# Patient Record
Sex: Female | Born: 1990 | Race: Black or African American | Hispanic: No | Marital: Single | Smoking: Never smoker
Health system: Southern US, Community
[De-identification: ages and names within clinical notes are randomized; demographics above are authoritative.]

---

## 2008-06-06 ENCOUNTER — Emergency Department (HOSPITAL_COMMUNITY): Admission: EM | Admit: 2008-06-06 | Discharge: 2008-06-06 | Payer: Self-pay | Admitting: Emergency Medicine

## 2013-08-04 ENCOUNTER — Encounter: Payer: Self-pay | Admitting: Obstetrics & Gynecology

## 2013-08-04 ENCOUNTER — Ambulatory Visit (INDEPENDENT_AMBULATORY_CARE_PROVIDER_SITE_OTHER): Payer: Self-pay

## 2013-08-04 DIAGNOSIS — Z3201 Encounter for pregnancy test, result positive: Secondary | ICD-10-CM

## 2013-08-04 DIAGNOSIS — N926 Irregular menstruation, unspecified: Secondary | ICD-10-CM

## 2013-08-04 LAB — POCT PREGNANCY, URINE: Preg Test, Ur: POSITIVE — AB

## 2013-08-04 LAB — HIV ANTIBODY (ROUTINE TESTING W REFLEX): HIV: NONREACTIVE

## 2013-08-04 NOTE — Progress Notes (Signed)
Pt. Here for pregnancy test; has not had her period since April 15, 2013. Pt. Would like to start care here. Labs to be drawn today.

## 2013-08-05 LAB — OBSTETRIC PANEL
Antibody Screen: NEGATIVE
Basophils Absolute: 0 10*3/uL (ref 0.0–0.1)
Basophils Relative: 0 % (ref 0–1)
HCT: 31 % — ABNORMAL LOW (ref 36.0–46.0)
Hepatitis B Surface Ag: NEGATIVE
MCH: 29.3 pg (ref 26.0–34.0)
MCHC: 34.8 g/dL (ref 30.0–36.0)
Monocytes Absolute: 0.5 10*3/uL (ref 0.1–1.0)
Monocytes Relative: 6 % (ref 3–12)
Neutro Abs: 6.3 10*3/uL (ref 1.7–7.7)
Neutrophils Relative %: 76 % (ref 43–77)
RDW: 14.4 % (ref 11.5–15.5)
WBC: 8.3 10*3/uL (ref 4.0–10.5)

## 2013-08-07 LAB — HEMOGLOBINOPATHY EVALUATION
Hgb A2 Quant: 3 % (ref 2.2–3.2)
Hgb F Quant: 0 % (ref 0.0–2.0)
Hgb S Quant: 39.8 % — ABNORMAL HIGH

## 2013-08-25 ENCOUNTER — Other Ambulatory Visit (HOSPITAL_COMMUNITY)
Admission: RE | Admit: 2013-08-25 | Discharge: 2013-08-25 | Disposition: A | Discharge status: Home / Self Care | Payer: Medicaid Other | Source: Ambulatory Visit | Attending: Family Medicine | Admitting: Family Medicine

## 2013-08-25 ENCOUNTER — Encounter: Payer: Self-pay | Admitting: Family Medicine

## 2013-08-25 ENCOUNTER — Ambulatory Visit (INDEPENDENT_AMBULATORY_CARE_PROVIDER_SITE_OTHER): Payer: Medicaid Other | Admitting: Family Medicine

## 2013-08-25 VITALS — BP 112/71 | Temp 98.5°F | Ht 63.0 in | Wt 131.0 lb

## 2013-08-25 DIAGNOSIS — Z113 Encounter for screening for infections with a predominantly sexual mode of transmission: Secondary | ICD-10-CM | POA: Insufficient documentation

## 2013-08-25 DIAGNOSIS — Z3480 Encounter for supervision of other normal pregnancy, unspecified trimester: Secondary | ICD-10-CM

## 2013-08-25 DIAGNOSIS — Z01419 Encounter for gynecological examination (general) (routine) without abnormal findings: Secondary | ICD-10-CM | POA: Insufficient documentation

## 2013-08-25 DIAGNOSIS — Z3490 Encounter for supervision of normal pregnancy, unspecified, unspecified trimester: Secondary | ICD-10-CM | POA: Insufficient documentation

## 2013-08-25 DIAGNOSIS — Z331 Pregnant state, incidental: Secondary | ICD-10-CM

## 2013-08-25 DIAGNOSIS — Z23 Encounter for immunization: Secondary | ICD-10-CM

## 2013-08-25 DIAGNOSIS — Z3492 Encounter for supervision of normal pregnancy, unspecified, second trimester: Secondary | ICD-10-CM

## 2013-08-25 LAB — POCT URINALYSIS DIP (DEVICE)
Glucose, UA: NEGATIVE mg/dL
Hgb urine dipstick: NEGATIVE
Nitrite: NEGATIVE
Protein, ur: NEGATIVE mg/dL
Urobilinogen, UA: 0.2 mg/dL (ref 0.0–1.0)

## 2013-08-25 NOTE — Progress Notes (Signed)
   Subjective:    Margaret [name] is a G1P0 [redacted]w[redacted]d being seen today for her first obstetrical visit.  Her obstetrical history is significant for none. Patient does intend to breast feed. Pregnancy history fully reviewed.  Patient reports no bleeding, no contractions, no cramping and no leaking.  Filed Vitals:   08/25/13 1317 08/25/13 1320  BP: 112/71   Temp: 98.5 F (36.9 C)   Height:  5\' 3"  (1.6 m)  Weight: 131 lb (59.421 kg)     HISTORY: OB History  Gravida Para Term Preterm AB SAB TAB Ectopic Multiple Living  1             # Outcome Date GA Lbr Len/2nd Weight Sex Delivery Anes PTL Lv  1 CUR              History reviewed. No pertinent past medical history. History reviewed. No pertinent past surgical history. Family History  Problem Relation Age of Onset  . Hypertension Mother   . Diabetes Father   . Stroke Maternal Grandmother   . Cancer Maternal Grandfather   . Cancer Paternal Grandmother      Exam    Uterus:     Pelvic Exam:    Perineum: No Hemorrhoids   Vulva: normal   Vagina:  normal mucosa   pH: Not eval   Cervix: bleeding following pap improved with pressure   Adnexa: not evaluated   Bony Pelvis: average  System:     Skin: normal coloration and turgor, no rashes    Neurologic: oriented, normal, negative, normal mood   Extremities: normal strength, tone, and muscle mass   HEENT extra ocular movement intact and sclera clear, anicteric   Mouth/Teeth mucous membranes moist, pharynx normal without lesions   Neck supple and no masses   Cardiovascular: regular rate and rhythm, no murmurs or gallops   Respiratory:  appears well, vitals normal, no respiratory distress, acyanotic, normal RR, ear and throat exam is normal, neck free of mass or lymphadenopathy, chest clear, no wheezing, crepitations, rhonchi, normal symmetric air entry   Abdomen: soft, non-tender; bowel sounds normal; no masses,  no organomegaly   Urinary: urethral meatus normal       Assessment:    Pregnancy: G1P0 Patient Active Problem List   Diagnosis Date Noted  . Supervision of low-risk pregnancy 08/25/2013        Plan:     Initial labs drawn. Prenatal vitamins. Problem list reviewed and updated. Genetic Screening discussed Quad Screen: ordered.  Ultrasound discussed; fetal survey: ordered.  Follow up in 4 weeks. 60% of 45 min visit spent on counseling and coordination of care.   F/u for peristent bleeding not resolved in 2d or if passing clots to ED.   Tawana Scale 08/25/2013

## 2013-08-25 NOTE — Progress Notes (Signed)
P=84  Initial prenatal care visit

## 2013-08-25 NOTE — Patient Instructions (Signed)
Second Trimester of Pregnancy The second trimester is from week 13 through week 28, months 4 through 6. The second trimester is often a time when you feel your best. Your body has also adjusted to being pregnant, and you begin to feel better physically. Usually, morning sickness has lessened or quit completely, you may have more energy, and you may have an increase in appetite. The second trimester is also a time when the fetus is growing rapidly. At the end of the sixth month, the fetus is about 9 inches long and weighs about 1 pounds. You will likely begin to feel the baby move (quickening) between 18 and 20 weeks of the pregnancy. BODY CHANGES Your body goes through many changes during pregnancy. The changes vary from woman to woman.   Your weight will continue to increase. You will notice your lower abdomen bulging out.  You may begin to get stretch marks on your hips, abdomen, and breasts.  You may develop headaches that can be relieved by medicines approved by your caregiver.  You may urinate more often because the fetus is pressing on your bladder.  You may develop or continue to have heartburn as a result of your pregnancy.  You may develop constipation because certain hormones are causing the muscles that push waste through your intestines to slow down.  You may develop hemorrhoids or swollen, bulging veins (varicose veins).  You may have back pain because of the weight gain and pregnancy hormones relaxing your joints between the bones in your pelvis and as a result of a shift in weight and the muscles that support your balance.  Your breasts will continue to grow and be tender.  Your gums may bleed and may be sensitive to brushing and flossing.  Dark spots or blotches (chloasma, mask of pregnancy) may develop on your face. This will likely fade after the baby is born.  A dark line from your belly button to the pubic area (linea nigra) may appear. This will likely fade after the  baby is born. WHAT TO EXPECT AT YOUR PRENATAL VISITS During a routine prenatal visit:  You will be weighed to make sure you and the fetus are growing normally.  Your blood pressure will be taken.  Your abdomen will be measured to track your baby's growth.  The fetal heartbeat will be listened to.  Any test results from the previous visit will be discussed. Your caregiver may ask you:  How you are feeling.  If you are feeling the baby move.  If you have had any abnormal symptoms, such as leaking fluid, bleeding, severe headaches, or abdominal cramping.  If you have any questions. Other tests that may be performed during your second trimester include:  Blood tests that check for:  Low iron levels (anemia).  Gestational diabetes (between 24 and 28 weeks).  Rh antibodies.  Urine tests to check for infections, diabetes, or protein in the urine.  An ultrasound to confirm the proper growth and development of the baby.  An amniocentesis to check for possible genetic problems.  Fetal screens for spina bifida and Down syndrome. HOME CARE INSTRUCTIONS   Avoid all smoking, herbs, alcohol, and unprescribed drugs. These chemicals affect the formation and growth of the baby.  Follow your caregiver's instructions regarding medicine use. There are medicines that are either safe or unsafe to take during pregnancy.  Exercise only as directed by your caregiver. Experiencing uterine cramps is a good sign to stop exercising.  Continue to eat regular,   healthy meals.  Wear a good support bra for breast tenderness.  Do not use hot tubs, steam rooms, or saunas.  Wear your seat belt at all times when driving.  Avoid raw meat, uncooked cheese, cat litter boxes, and soil used by cats. These carry germs that can cause birth defects in the baby.  Take your prenatal vitamins.  Try taking a stool softener (if your caregiver approves) if you develop constipation. Eat more high-fiber foods,  such as fresh vegetables or fruit and whole grains. Drink plenty of fluids to keep your urine clear or pale yellow.  Take warm sitz baths to soothe any pain or discomfort caused by hemorrhoids. Use hemorrhoid cream if your caregiver approves.  If you develop varicose veins, wear support hose. Elevate your feet for 15 minutes, 3 4 times a day. Limit salt in your diet.  Avoid heavy lifting, wear low heel shoes, and practice good posture.  Rest with your legs elevated if you have leg cramps or low back pain.  Visit your dentist if you have not gone yet during your pregnancy. Use a soft toothbrush to brush your teeth and be gentle when you floss.  A sexual relationship may be continued unless your caregiver directs you otherwise.  Continue to go to all your prenatal visits as directed by your caregiver. SEEK MEDICAL CARE IF:   You have dizziness.  You have mild pelvic cramps, pelvic pressure, or nagging pain in the abdominal area.  You have persistent nausea, vomiting, or diarrhea.  You have a bad smelling vaginal discharge.  You have pain with urination. SEEK IMMEDIATE MEDICAL CARE IF:   You have a fever.  You are leaking fluid from your vagina.  You have spotting or bleeding from your vagina.  You have severe abdominal cramping or pain.  You have rapid weight gain or loss.  You have shortness of breath with chest pain.  You notice sudden or extreme swelling of your face, hands, ankles, feet, or legs.  You have not felt your baby move in over an hour.  You have severe headaches that do not go away with medicine.  You have vision changes. Document Released: 08/21/2001 Document Revised: 04/29/2013 Document Reviewed: 10/28/2012 ExitCare Patient Information 2014 [employer].  

## 2013-08-26 LAB — PRESCRIPTION MONITORING PROFILE (19 PANEL)
Barbiturate Screen, Urine: NEGATIVE ng/mL
Benzodiazepine Screen, Urine: NEGATIVE ng/mL
Cannabinoid Scrn, Ur: NEGATIVE ng/mL
Carisoprodol, Urine: NEGATIVE ng/mL
Cocaine Metabolites: NEGATIVE ng/mL
Creatinine, Urine: 68.65 mg/dL (ref >20.0)
Fentanyl, Ur: NEGATIVE ng/mL
MDMA URINE: NEGATIVE ng/mL
Meperidine, Ur: NEGATIVE ng/mL
Methadone Screen, Urine: NEGATIVE ng/mL
Nitrites, Initial: NEGATIVE ug/mL
Opiate Screen, Urine: NEGATIVE ng/mL
Oxycodone Screen, Ur: NEGATIVE ng/mL
Phencyclidine, Ur: NEGATIVE ng/mL
Tapentadol, urine: NEGATIVE ng/mL
Tramadol Scrn, Ur: NEGATIVE ng/mL
Zolpidem, Urine: NEGATIVE ng/mL
pH, Initial: 6.9 pH (ref 4.5–8.9)

## 2013-08-26 LAB — WET PREP, GENITAL
Clue Cells Wet Prep HPF POC: NONE SEEN
Trich, Wet Prep: NONE SEEN
WBC, Wet Prep HPF POC: NONE SEEN
Yeast Wet Prep HPF POC: NONE SEEN

## 2013-08-27 ENCOUNTER — Ambulatory Visit (HOSPITAL_COMMUNITY)
Admission: RE | Admit: 2013-08-27 | Discharge: 2013-08-27 | Disposition: A | Discharge status: Home / Self Care | Payer: Medicaid Other | Source: Ambulatory Visit | Attending: Family Medicine | Admitting: Family Medicine

## 2013-08-27 DIAGNOSIS — Z3689 Encounter for other specified antenatal screening: Secondary | ICD-10-CM | POA: Insufficient documentation

## 2013-08-27 DIAGNOSIS — Z3492 Encounter for supervision of normal pregnancy, unspecified, second trimester: Secondary | ICD-10-CM

## 2013-08-27 DIAGNOSIS — Z3490 Encounter for supervision of normal pregnancy, unspecified, unspecified trimester: Secondary | ICD-10-CM

## 2013-08-27 DIAGNOSIS — Z23 Encounter for immunization: Secondary | ICD-10-CM

## 2013-08-27 LAB — CULTURE, OB URINE
Colony Count: NO GROWTH
Organism ID, Bacteria: NO GROWTH

## 2013-08-31 LAB — AFP, QUAD SCREEN
AFP: 39.1 IU/mL
Age Alone: 1:1060 {titer}
Curr Gest Age: 18.6 wks.days
MoM for AFP: 0.76
MoM for INH: 1.35
Open Spina bifida: NEGATIVE
Trisomy 18 (Edward) Syndrome Interp.: 1:63300 {titer}
uE3 Value: 1.1 ng/mL

## 2013-09-04 ENCOUNTER — Encounter: Payer: Self-pay | Admitting: Family Medicine

## 2013-09-10 NOTE — L&D Delivery Note (Signed)
Present for procedure.  Attestation of Attending Supervision of Fellow: Evaluation and management procedures were performed by the Fellow under my supervision and collaboration. I have reviewed the Fellow's note and chart, and I agree with the management and plan.

## 2013-09-10 NOTE — L&D Delivery Note (Signed)
Operative Delivery Note At 12:24 PM a viable female was delivered via VAVD.  Presentation: vertex; Position: Left,, Occiput,, Anterior; Station: +2.  Verbal consent: obtained from patient.  Risks and benefits discussed in detail.  Risks include, but are not limited to the risks of anesthesia, bleeding, infection, damage to maternal tissues, fetal cephalhematoma.  There is also the risk of inability to effect vaginal delivery of the head, or shoulder dystocia that cannot be resolved by established maneuvers, leading to the need for emergency cesarean section.  APGAR: 7, 9; weight 6 lb 13 oz (3090 g).   Placenta status: Intact, Spontaneous.   Cord: 3 vessels with the following complications: .  Cord pH: clotted  Anesthesia: Epidural  Instruments: KIWI vacuum Episiotomy: None Lacerations: 1st degree perineal Suture Repair: 3.0 vicryl Est. Blood Loss (mL): 300cc  Mom to postpartum.  Baby to Couplet care / Skin to Skin.  Called to room for prolonged decel to the 60s into its 4th minute.  Pt found to be complete and +2 in OA position. Verbal consent was obtained for an outlet vacuum delivery due to fetal bradycardia.  With pt permission the KIWI vacuum was applied with care to remain anterior to the posterior fontanelle and remain along the sagittal suture.  NICU was called for delivery. Pressure was increased to 500 mmHg and pt pushed with good maternal effort with 3 contractions and pulling to bring the head to the perineum. 1 popoff.  She then pushed with 2 more contractions without assistance to deliver a liveborn female with spontaneous cry.   Baby taken to the warmer where NICU was present and assessed the situation.  Placenta delivered intact with 3V cord via traction and pitocin.  1st degree tear requiring 3 sutures for hemostasis. No complications.  Mom and baby to postpartum. Baby transferred to maternal abdomen prior to my exit.   Dr. Penne LashLeggett was present for and supervised the delivery of this  newborn.    Margaret [name] 01/10/2014, 12:51 PM

## 2013-09-22 ENCOUNTER — Encounter: Payer: Self-pay | Admitting: Obstetrics and Gynecology

## 2013-09-22 ENCOUNTER — Ambulatory Visit (INDEPENDENT_AMBULATORY_CARE_PROVIDER_SITE_OTHER): Payer: Medicaid Other | Admitting: Obstetrics and Gynecology

## 2013-09-22 VITALS — BP 115/77 | Temp 99.2°F | Wt 141.0 lb

## 2013-09-22 DIAGNOSIS — Z3480 Encounter for supervision of other normal pregnancy, unspecified trimester: Secondary | ICD-10-CM

## 2013-09-22 DIAGNOSIS — Z3490 Encounter for supervision of normal pregnancy, unspecified, unspecified trimester: Secondary | ICD-10-CM

## 2013-09-22 LAB — POCT URINALYSIS DIP (DEVICE)
Bilirubin Urine: NEGATIVE
GLUCOSE, UA: NEGATIVE mg/dL
HGB URINE DIPSTICK: NEGATIVE
KETONES UR: NEGATIVE mg/dL
Nitrite: NEGATIVE
Protein, ur: NEGATIVE mg/dL
SPECIFIC GRAVITY, URINE: 1.01 (ref 1.005–1.030)
Urobilinogen, UA: 0.2 mg/dL (ref 0.0–1.0)
pH: 6 (ref 5.0–8.0)

## 2013-09-22 NOTE — Progress Notes (Signed)
P= 77 C/o of frequent heartburn; requests medication.

## 2013-09-22 NOTE — Progress Notes (Signed)
Doing well. Has not tried OTC antacids. Will call here for RX if ineffective. 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

## 2013-09-22 NOTE — Patient Instructions (Signed)
Heartburn  Heartburn is a painful, burning sensation in the chest. It may feel worse in certain positions, such as lying down or bending over. It is caused by stomach acid backing up into the tube that carries food from the mouth down to the stomach (lower esophagus).   CAUSES   · Large meals.  · Certain foods and drinks.  · Exercise.  · Increased acid production.  · Being overweight or obese.  · Certain medicines.  SYMPTOMS   · Burning pain in the chest or lower throat.  · Bitter taste in the mouth.  · Coughing.  DIAGNOSIS   If the usual treatments for heartburn do not improve your symptoms, then tests may be done to see if there is another condition present. Possible tests may include:  · X-rays.  · Endoscopy. This is when a tube with a light and a camera on the end is used to examine the esophagus and the stomach.  · A test to measure the amount of acid in the esophagus (pH test).  · A test to see if the esophagus is working properly (esophageal manometry).  · Blood, breath, or stool tests to check for bacteria that cause ulcers.  TREATMENT   · Your caregiver may tell you to use certain over-the-counter medicines (antacids, acid reducers) for mild heartburn.  · Your caregiver may prescribe medicines to decrease the acid in your stomach or protect your stomach lining.  · Your caregiver may recommend certain diet changes.  · For severe cases, your caregiver may recommend that the head of your bed be elevated on blocks. (Sleeping with more pillows is not an effective treatment as it only changes the position of your head and does not improve the main problem of stomach acid refluxing into the esophagus.)  HOME CARE INSTRUCTIONS   · Take all medicines as directed by your caregiver.  · Raise the head of your bed by putting blocks under the legs if instructed to by your caregiver.  · Do not exercise right after eating.  · Avoid eating 2 or 3 hours before bed. Do not lie down right after eating.  · Eat small meals  throughout the day instead of 3 large meals.  · Stop smoking if you smoke.  · Maintain a healthy weight.  · Identify foods and beverages that make your symptoms worse and avoid them. Foods you may want to avoid include:  · Peppers.  · Chocolate.  · High-fat foods, including fried foods.  · Spicy foods.  · Garlic and onions.  · Citrus fruits, including oranges, grapefruit, lemons, and limes.  · Food containing tomatoes or tomato products.  · Mint.  · Carbonated drinks, caffeinated drinks, and alcohol.  · Vinegar.  SEEK IMMEDIATE MEDICAL CARE IF:  · You have severe chest pain that goes down your arm or into your jaw or neck.  · You feel sweaty, dizzy, or lightheaded.  · You are short of breath.  · You vomit blood.  · You have difficulty or pain with swallowing.  · You have bloody or black, tarry stools.  · You have episodes of heartburn more than 3 times a week for more than 2 weeks.  MAKE SURE YOU:  · Understand these instructions.  · Will watch your condition.  · Will get help right away if you are not doing well or get worse.  Document Released: 01/13/2009 Document Revised: 11/19/2011 Document Reviewed: 02/11/2011  ExitCare® Patient Information ©2014 [employer].

## 2013-10-20 ENCOUNTER — Ambulatory Visit (INDEPENDENT_AMBULATORY_CARE_PROVIDER_SITE_OTHER): Payer: Medicaid Other | Admitting: Family Medicine

## 2013-10-20 VITALS — BP 116/76 | Temp 97.8°F | Wt 148.8 lb

## 2013-10-20 DIAGNOSIS — Z3400 Encounter for supervision of normal first pregnancy, unspecified trimester: Secondary | ICD-10-CM

## 2013-10-20 DIAGNOSIS — Z3490 Encounter for supervision of normal pregnancy, unspecified, unspecified trimester: Secondary | ICD-10-CM

## 2013-10-20 LAB — POCT URINALYSIS DIP (DEVICE)
Bilirubin Urine: NEGATIVE
GLUCOSE, UA: NEGATIVE mg/dL
Hgb urine dipstick: NEGATIVE
KETONES UR: NEGATIVE mg/dL
Nitrite: NEGATIVE
Protein, ur: NEGATIVE mg/dL
SPECIFIC GRAVITY, URINE: 1.015 (ref 1.005–1.030)
UROBILINOGEN UA: 0.2 mg/dL (ref 0.0–1.0)
pH: 7 (ref 5.0–8.0)

## 2013-10-20 MED ORDER — TETANUS-DIPHTH-ACELL PERTUSSIS 5-2.5-18.5 LF-MCG/0.5 IM SUSP: 0.5000 mL | Freq: Once | INTRAMUSCULAR | Status: DC

## 2013-10-20 NOTE — Patient Instructions (Signed)
Second Trimester of Pregnancy The second trimester is from week 13 through week 28, months 4 through 6. The second trimester is often a time when you feel your best. Your body has also adjusted to being pregnant, and you begin to feel better physically. Usually, morning sickness has lessened or quit completely, you may have more energy, and you may have an increase in appetite. The second trimester is also a time when the fetus is growing rapidly. At the end of the sixth month, the fetus is about 9 inches long and weighs about 1 pounds. You will likely begin to feel the baby move (quickening) between 18 and 20 weeks of the pregnancy. BODY CHANGES Your body goes through many changes during pregnancy. The changes vary from woman to woman.   Your weight will continue to increase. You will notice your lower abdomen bulging out.  You may begin to get stretch marks on your hips, abdomen, and breasts.  You may develop headaches that can be relieved by medicines approved by your caregiver.  You may urinate more often because the fetus is pressing on your bladder.  You may develop or continue to have heartburn as a result of your pregnancy.  You may develop constipation because certain hormones are causing the muscles that push waste through your intestines to slow down.  You may develop hemorrhoids or swollen, bulging veins (varicose veins).  You may have back pain because of the weight gain and pregnancy hormones relaxing your joints between the bones in your pelvis and as a result of a shift in weight and the muscles that support your balance.  Your breasts will continue to grow and be tender.  Your gums may bleed and may be sensitive to brushing and flossing.  Dark spots or blotches (chloasma, mask of pregnancy) may develop on your face. This will likely fade after the baby is born.  A dark line from your belly button to the pubic area (linea nigra) may appear. This will likely fade after the  baby is born. WHAT TO EXPECT AT YOUR PRENATAL VISITS During a routine prenatal visit:  You will be weighed to make sure you and the fetus are growing normally.  Your blood pressure will be taken.  Your abdomen will be measured to track your baby's growth.  The fetal heartbeat will be listened to.  Any test results from the previous visit will be discussed. Your caregiver may ask you:  How you are feeling.  If you are feeling the baby move.  If you have had any abnormal symptoms, such as leaking fluid, bleeding, severe headaches, or abdominal cramping.  If you have any questions. Other tests that may be performed during your second trimester include:  Blood tests that check for:  Low iron levels (anemia).  Gestational diabetes (between 24 and 28 weeks).  Rh antibodies.  Urine tests to check for infections, diabetes, or protein in the urine.  An ultrasound to confirm the proper growth and development of the baby.  An amniocentesis to check for possible genetic problems.  Fetal screens for spina bifida and Down syndrome. HOME CARE INSTRUCTIONS   Avoid all smoking, herbs, alcohol, and unprescribed drugs. These chemicals affect the formation and growth of the baby.  Follow your caregiver's instructions regarding medicine use. There are medicines that are either safe or unsafe to take during pregnancy.  Exercise only as directed by your caregiver. Experiencing uterine cramps is a good sign to stop exercising.  Continue to eat regular,   healthy meals.  Wear a good support bra for breast tenderness.  Do not use hot tubs, steam rooms, or saunas.  Wear your seat belt at all times when driving.  Avoid raw meat, uncooked cheese, cat litter boxes, and soil used by cats. These carry germs that can cause birth defects in the baby.  Take your prenatal vitamins.  Try taking a stool softener (if your caregiver approves) if you develop constipation. Eat more high-fiber foods,  such as fresh vegetables or fruit and whole grains. Drink plenty of fluids to keep your urine clear or pale yellow.  Take warm sitz baths to soothe any pain or discomfort caused by hemorrhoids. Use hemorrhoid cream if your caregiver approves.  If you develop varicose veins, wear support hose. Elevate your feet for 15 minutes, 3 4 times a day. Limit salt in your diet.  Avoid heavy lifting, wear low heel shoes, and practice good posture.  Rest with your legs elevated if you have leg cramps or low back pain.  Visit your dentist if you have not gone yet during your pregnancy. Use a soft toothbrush to brush your teeth and be gentle when you floss.  A sexual relationship may be continued unless your caregiver directs you otherwise.  Continue to go to all your prenatal visits as directed by your caregiver. SEEK MEDICAL CARE IF:   You have dizziness.  You have mild pelvic cramps, pelvic pressure, or nagging pain in the abdominal area.  You have persistent nausea, vomiting, or diarrhea.  You have a bad smelling vaginal discharge.  You have pain with urination. SEEK IMMEDIATE MEDICAL CARE IF:   You have a fever.  You are leaking fluid from your vagina.  You have spotting or bleeding from your vagina.  You have severe abdominal cramping or pain.  You have rapid weight gain or loss.  You have shortness of breath with chest pain.  You notice sudden or extreme swelling of your face, hands, ankles, feet, or legs.  You have not felt your baby move in over an hour.  You have severe headaches that do not go away with medicine.  You have vision changes. Document Released: 08/21/2001 Document Revised: 04/29/2013 Document Reviewed: 10/28/2012 ExitCare Patient Information 2014 [employer].  

## 2013-10-20 NOTE — Progress Notes (Signed)
S: 23 yo G1 @ 3455w6d here for ROBV - no bleeding, pain, ctx, LOF.  - +FM - heartburn controlled with OTC antacids.   O: see flowsheet  A/P - doing well - 28 week labs today F/u 2 weeks

## 2013-10-20 NOTE — Progress Notes (Signed)
Pulse: 81

## 2013-10-21 ENCOUNTER — Encounter: Payer: Self-pay | Admitting: Family Medicine

## 2013-10-21 LAB — CBC
HEMATOCRIT: 31 % — AB (ref 36.0–46.0)
HEMOGLOBIN: 10.7 g/dL — AB (ref 12.0–15.0)
MCH: 29.7 pg (ref 26.0–34.0)
MCHC: 34.5 g/dL (ref 30.0–36.0)
MCV: 86.1 fL (ref 78.0–100.0)
Platelets: 234 10*3/uL (ref 150–400)
RBC: 3.6 MIL/uL — ABNORMAL LOW (ref 3.87–5.11)
RDW: 13.4 % (ref 11.5–15.5)
WBC: 8.9 10*3/uL (ref 4.0–10.5)

## 2013-10-21 LAB — HIV ANTIBODY (ROUTINE TESTING W REFLEX): HIV: NONREACTIVE

## 2013-10-21 LAB — RPR

## 2013-10-21 LAB — GLUCOSE TOLERANCE, 1 HOUR (50G) W/O FASTING: GLUCOSE 1 HOUR GTT: 104 mg/dL (ref 70–140)

## 2013-11-03 ENCOUNTER — Encounter: Payer: Medicaid Other | Admitting: Advanced Practice Midwife

## 2013-11-11 ENCOUNTER — Encounter: Payer: Self-pay | Admitting: Obstetrics and Gynecology

## 2013-11-11 ENCOUNTER — Ambulatory Visit (INDEPENDENT_AMBULATORY_CARE_PROVIDER_SITE_OTHER): Payer: Medicaid Other | Admitting: Obstetrics and Gynecology

## 2013-11-11 VITALS — BP 117/74 | Temp 97.6°F | Wt 153.4 lb

## 2013-11-11 DIAGNOSIS — Z3480 Encounter for supervision of other normal pregnancy, unspecified trimester: Secondary | ICD-10-CM

## 2013-11-11 DIAGNOSIS — Z3490 Encounter for supervision of normal pregnancy, unspecified, unspecified trimester: Secondary | ICD-10-CM

## 2013-11-11 LAB — POCT URINALYSIS DIP (DEVICE)
BILIRUBIN URINE: NEGATIVE
Glucose, UA: NEGATIVE mg/dL
HGB URINE DIPSTICK: NEGATIVE
KETONES UR: NEGATIVE mg/dL
NITRITE: NEGATIVE
Protein, ur: NEGATIVE mg/dL
SPECIFIC GRAVITY, URINE: 1.015 (ref 1.005–1.030)
Urobilinogen, UA: 0.2 mg/dL (ref 0.0–1.0)
pH: 7 (ref 5.0–8.0)

## 2013-11-11 NOTE — Progress Notes (Signed)
Doing well. Works at [employer]. Plans to attend classes. Has "bump." Mons pubis: 1 cm furuncle, not draining> advised hot soaks.

## 2013-11-11 NOTE — Patient Instructions (Signed)
Third Trimester of Pregnancy  The third trimester is from week 29 through week 42, months 7 through 9. The third trimester is a time when the fetus is growing rapidly. At the end of the ninth month, the fetus is about 20 inches in length and weighs 6 10 pounds.   BODY CHANGES  Your body goes through many changes during pregnancy. The changes vary from woman to woman.    Your weight will continue to increase. You can expect to gain 25 35 pounds (11 16 kg) by the end of the pregnancy.   You may begin to get stretch marks on your hips, abdomen, and breasts.   You may urinate more often because the fetus is moving lower into your pelvis and pressing on your bladder.   You may develop or continue to have heartburn as a result of your pregnancy.   You may develop constipation because certain hormones are causing the muscles that push waste through your intestines to slow down.   You may develop hemorrhoids or swollen, bulging veins (varicose veins).   You may have pelvic pain because of the weight gain and pregnancy hormones relaxing your joints between the bones in your pelvis. Back aches may result from over exertion of the muscles supporting your posture.   Your breasts will continue to grow and be tender. A yellow discharge may leak from your breasts called colostrum.   Your belly button may stick out.   You may feel short of breath because of your expanding uterus.   You may notice the fetus "dropping," or moving lower in your abdomen.   You may have a bloody mucus discharge. This usually occurs a few days to a week before labor begins.   Your cervix becomes thin and soft (effaced) near your due date.  WHAT TO EXPECT AT YOUR PRENATAL EXAMS   You will have prenatal exams every 2 weeks until week 36. Then, you will have weekly prenatal exams. During a routine prenatal visit:   You will be weighed to make sure you and the fetus are growing normally.   Your blood pressure is taken.   Your abdomen will be  measured to track your baby's growth.   The fetal heartbeat will be listened to.   Any test results from the previous visit will be discussed.   You may have a cervical check near your due date to see if you have effaced.  At around 36 weeks, your caregiver will check your cervix. At the same time, your caregiver will also perform a test on the secretions of the vaginal tissue. This test is to determine if a type of bacteria, Group B streptococcus, is present. Your caregiver will explain this further.  Your caregiver may ask you:   What your birth plan is.   How you are feeling.   If you are feeling the baby move.   If you have had any abnormal symptoms, such as leaking fluid, bleeding, severe headaches, or abdominal cramping.   If you have any questions.  Other tests or screenings that may be performed during your third trimester include:   Blood tests that check for low iron levels (anemia).   Fetal testing to check the health, activity level, and growth of the fetus. Testing is done if you have certain medical conditions or if there are problems during the pregnancy.  FALSE LABOR  You may feel small, irregular contractions that eventually go away. These are called Braxton Hicks contractions, or   false labor. Contractions may last for hours, days, or even weeks before true labor sets in. If contractions come at regular intervals, intensify, or become painful, it is best to be seen by your caregiver.   SIGNS OF LABOR    Menstrual-like cramps.   Contractions that are 5 minutes apart or less.   Contractions that start on the top of the uterus and spread down to the lower abdomen and back.   A sense of increased pelvic pressure or back pain.   A watery or bloody mucus discharge that comes from the vagina.  If you have any of these signs before the 37th week of pregnancy, call your caregiver right away. You need to go to the hospital to get checked immediately.  HOME CARE INSTRUCTIONS    Avoid all  smoking, herbs, alcohol, and unprescribed drugs. These chemicals affect the formation and growth of the baby.   Follow your caregiver's instructions regarding medicine use. There are medicines that are either safe or unsafe to take during pregnancy.   Exercise only as directed by your caregiver. Experiencing uterine cramps is a good sign to stop exercising.   Continue to eat regular, healthy meals.   Wear a good support bra for breast tenderness.   Do not use hot tubs, steam rooms, or saunas.   Wear your seat belt at all times when driving.   Avoid raw meat, uncooked cheese, cat litter boxes, and soil used by cats. These carry germs that can cause birth defects in the baby.   Take your prenatal vitamins.   Try taking a stool softener (if your caregiver approves) if you develop constipation. Eat more high-fiber foods, such as fresh vegetables or fruit and whole grains. Drink plenty of fluids to keep your urine clear or pale yellow.   Take warm sitz baths to soothe any pain or discomfort caused by hemorrhoids. Use hemorrhoid cream if your caregiver approves.   If you develop varicose veins, wear support hose. Elevate your feet for 15 minutes, 3 4 times a day. Limit salt in your diet.   Avoid heavy lifting, wear low heal shoes, and practice good posture.   Rest a lot with your legs elevated if you have leg cramps or low back pain.   Visit your dentist if you have not gone during your pregnancy. Use a soft toothbrush to brush your teeth and be gentle when you floss.   A sexual relationship may be continued unless your caregiver directs you otherwise.   Do not travel far distances unless it is absolutely necessary and only with the approval of your caregiver.   Take prenatal classes to understand, practice, and ask questions about the labor and delivery.   Make a trial run to the hospital.   Pack your hospital bag.   Prepare the baby's nursery.   Continue to go to all your prenatal visits as directed  by your caregiver.  SEEK MEDICAL CARE IF:   You are unsure if you are in labor or if your water has broken.   You have dizziness.   You have mild pelvic cramps, pelvic pressure, or nagging pain in your abdominal area.   You have persistent nausea, vomiting, or diarrhea.   You have a bad smelling vaginal discharge.   You have pain with urination.  SEEK IMMEDIATE MEDICAL CARE IF:    You have a fever.   You are leaking fluid from your vagina.   You have spotting or bleeding from your vagina.     You have severe abdominal cramping or pain.   You have rapid weight loss or gain.   You have shortness of breath with chest pain.   You notice sudden or extreme swelling of your face, hands, ankles, feet, or legs.   You have not felt your baby move in over an hour.   You have severe headaches that do not go away with medicine.   You have vision changes.  Document Released: 08/21/2001 Document Revised: 04/29/2013 Document Reviewed: 10/28/2012  ExitCare Patient Information 2014 [employer].

## 2013-11-11 NOTE — Progress Notes (Signed)
Pulse- 80 

## 2013-11-11 NOTE — Addendum Note (Signed)
Addended by: Gerome ApleyZEYFANG, Wasif Simonich L on: 11/11/2013 03:22 PM   Modules accepted: Orders

## 2013-11-25 ENCOUNTER — Ambulatory Visit (INDEPENDENT_AMBULATORY_CARE_PROVIDER_SITE_OTHER): Payer: Medicaid Other | Admitting: Family Medicine

## 2013-11-25 ENCOUNTER — Encounter: Payer: Self-pay | Admitting: Family Medicine

## 2013-11-25 VITALS — BP 112/75 | Temp 97.6°F | Wt 157.8 lb

## 2013-11-25 DIAGNOSIS — Z3490 Encounter for supervision of normal pregnancy, unspecified, unspecified trimester: Secondary | ICD-10-CM

## 2013-11-25 DIAGNOSIS — Z3480 Encounter for supervision of other normal pregnancy, unspecified trimester: Secondary | ICD-10-CM

## 2013-11-25 LAB — POCT URINALYSIS DIP (DEVICE)
BILIRUBIN URINE: NEGATIVE
Glucose, UA: NEGATIVE mg/dL
Hgb urine dipstick: NEGATIVE
KETONES UR: NEGATIVE mg/dL
Nitrite: NEGATIVE
PH: 7 (ref 5.0–8.0)
Protein, ur: NEGATIVE mg/dL
Specific Gravity, Urine: 1.015 (ref 1.005–1.030)
Urobilinogen, UA: 0.2 mg/dL (ref 0.0–1.0)

## 2013-11-25 NOTE — Progress Notes (Signed)
23 yo G1 @ 6567w0d here for ROBV - no ctx, lof, vb. +FM.   O: see flowsheet  A/P - hgb low 10s- will start iron once daily - PTL precautions discussed.  - f/u 2 weeks

## 2013-11-25 NOTE — Patient Instructions (Signed)
Third Trimester of Pregnancy  The third trimester is from week 29 through week 42, months 7 through 9. The third trimester is a time when the fetus is growing rapidly. At the end of the ninth month, the fetus is about 20 inches in length and weighs 6 10 pounds.   BODY CHANGES  Your body goes through many changes during pregnancy. The changes vary from woman to woman.    Your weight will continue to increase. You can expect to gain 25 35 pounds (11 16 kg) by the end of the pregnancy.   You may begin to get stretch marks on your hips, abdomen, and breasts.   You may urinate more often because the fetus is moving lower into your pelvis and pressing on your bladder.   You may develop or continue to have heartburn as a result of your pregnancy.   You may develop constipation because certain hormones are causing the muscles that push waste through your intestines to slow down.   You may develop hemorrhoids or swollen, bulging veins (varicose veins).   You may have pelvic pain because of the weight gain and pregnancy hormones relaxing your joints between the bones in your pelvis. Back aches may result from over exertion of the muscles supporting your posture.   Your breasts will continue to grow and be tender. A yellow discharge may leak from your breasts called colostrum.   Your belly button may stick out.   You may feel short of breath because of your expanding uterus.   You may notice the fetus "dropping," or moving lower in your abdomen.   You may have a bloody mucus discharge. This usually occurs a few days to a week before labor begins.   Your cervix becomes thin and soft (effaced) near your due date.  WHAT TO EXPECT AT YOUR PRENATAL EXAMS   You will have prenatal exams every 2 weeks until week 36. Then, you will have weekly prenatal exams. During a routine prenatal visit:   You will be weighed to make sure you and the fetus are growing normally.   Your blood pressure is taken.   Your abdomen will be  measured to track your baby's growth.   The fetal heartbeat will be listened to.   Any test results from the previous visit will be discussed.   You may have a cervical check near your due date to see if you have effaced.  At around 36 weeks, your caregiver will check your cervix. At the same time, your caregiver will also perform a test on the secretions of the vaginal tissue. This test is to determine if a type of bacteria, Group B streptococcus, is present. Your caregiver will explain this further.  Your caregiver may ask you:   What your birth plan is.   How you are feeling.   If you are feeling the baby move.   If you have had any abnormal symptoms, such as leaking fluid, bleeding, severe headaches, or abdominal cramping.   If you have any questions.  Other tests or screenings that may be performed during your third trimester include:   Blood tests that check for low iron levels (anemia).   Fetal testing to check the health, activity level, and growth of the fetus. Testing is done if you have certain medical conditions or if there are problems during the pregnancy.  FALSE LABOR  You may feel small, irregular contractions that eventually go away. These are called Braxton Hicks contractions, or   false labor. Contractions may last for hours, days, or even weeks before true labor sets in. If contractions come at regular intervals, intensify, or become painful, it is best to be seen by your caregiver.   SIGNS OF LABOR    Menstrual-like cramps.   Contractions that are 5 minutes apart or less.   Contractions that start on the top of the uterus and spread down to the lower abdomen and back.   A sense of increased pelvic pressure or back pain.   A watery or bloody mucus discharge that comes from the vagina.  If you have any of these signs before the 37th week of pregnancy, call your caregiver right away. You need to go to the hospital to get checked immediately.  HOME CARE INSTRUCTIONS    Avoid all  smoking, herbs, alcohol, and unprescribed drugs. These chemicals affect the formation and growth of the baby.   Follow your caregiver's instructions regarding medicine use. There are medicines that are either safe or unsafe to take during pregnancy.   Exercise only as directed by your caregiver. Experiencing uterine cramps is a good sign to stop exercising.   Continue to eat regular, healthy meals.   Wear a good support bra for breast tenderness.   Do not use hot tubs, steam rooms, or saunas.   Wear your seat belt at all times when driving.   Avoid raw meat, uncooked cheese, cat litter boxes, and soil used by cats. These carry germs that can cause birth defects in the baby.   Take your prenatal vitamins.   Try taking a stool softener (if your caregiver approves) if you develop constipation. Eat more high-fiber foods, such as fresh vegetables or fruit and whole grains. Drink plenty of fluids to keep your urine clear or pale yellow.   Take warm sitz baths to soothe any pain or discomfort caused by hemorrhoids. Use hemorrhoid cream if your caregiver approves.   If you develop varicose veins, wear support hose. Elevate your feet for 15 minutes, 3 4 times a day. Limit salt in your diet.   Avoid heavy lifting, wear low heal shoes, and practice good posture.   Rest a lot with your legs elevated if you have leg cramps or low back pain.   Visit your dentist if you have not gone during your pregnancy. Use a soft toothbrush to brush your teeth and be gentle when you floss.   A sexual relationship may be continued unless your caregiver directs you otherwise.   Do not travel far distances unless it is absolutely necessary and only with the approval of your caregiver.   Take prenatal classes to understand, practice, and ask questions about the labor and delivery.   Make a trial run to the hospital.   Pack your hospital bag.   Prepare the baby's nursery.   Continue to go to all your prenatal visits as directed  by your caregiver.  SEEK MEDICAL CARE IF:   You are unsure if you are in labor or if your water has broken.   You have dizziness.   You have mild pelvic cramps, pelvic pressure, or nagging pain in your abdominal area.   You have persistent nausea, vomiting, or diarrhea.   You have a bad smelling vaginal discharge.   You have pain with urination.  SEEK IMMEDIATE MEDICAL CARE IF:    You have a fever.   You are leaking fluid from your vagina.   You have spotting or bleeding from your vagina.     You have severe abdominal cramping or pain.   You have rapid weight loss or gain.   You have shortness of breath with chest pain.   You notice sudden or extreme swelling of your face, hands, ankles, feet, or legs.   You have not felt your baby move in over an hour.   You have severe headaches that do not go away with medicine.   You have vision changes.  Document Released: 08/21/2001 Document Revised: 04/29/2013 Document Reviewed: 10/28/2012  ExitCare Patient Information 2014 [employer].

## 2013-11-25 NOTE — Progress Notes (Signed)
p=91 

## 2013-12-09 ENCOUNTER — Ambulatory Visit (INDEPENDENT_AMBULATORY_CARE_PROVIDER_SITE_OTHER): Payer: Medicaid Other | Admitting: Advanced Practice Midwife

## 2013-12-09 VITALS — BP 123/83 | Temp 97.3°F | Wt 160.9 lb

## 2013-12-09 DIAGNOSIS — Z3480 Encounter for supervision of other normal pregnancy, unspecified trimester: Secondary | ICD-10-CM

## 2013-12-09 DIAGNOSIS — Z3490 Encounter for supervision of normal pregnancy, unspecified, unspecified trimester: Secondary | ICD-10-CM

## 2013-12-09 LAB — POCT URINALYSIS DIP (DEVICE)
Bilirubin Urine: NEGATIVE
Glucose, UA: NEGATIVE mg/dL
KETONES UR: NEGATIVE mg/dL
Nitrite: NEGATIVE
PH: 5.5 (ref 5.0–8.0)
PROTEIN: NEGATIVE mg/dL
Urobilinogen, UA: 0.2 mg/dL (ref 0.0–1.0)

## 2013-12-09 NOTE — Progress Notes (Signed)
P= 79 Ocassional edema in hands and feet. C/o of braxton hicks.

## 2013-12-09 NOTE — Progress Notes (Signed)
No complaints, feeling well today. GBS at next visit.

## 2013-12-23 ENCOUNTER — Encounter: Payer: Self-pay | Admitting: Advanced Practice Midwife

## 2013-12-23 ENCOUNTER — Other Ambulatory Visit: Payer: Self-pay | Admitting: Advanced Practice Midwife

## 2013-12-23 ENCOUNTER — Ambulatory Visit (INDEPENDENT_AMBULATORY_CARE_PROVIDER_SITE_OTHER): Payer: Medicaid Other | Admitting: Advanced Practice Midwife

## 2013-12-23 VITALS — BP 127/90 | Temp 97.4°F | Wt 165.5 lb

## 2013-12-23 DIAGNOSIS — Z3480 Encounter for supervision of other normal pregnancy, unspecified trimester: Secondary | ICD-10-CM

## 2013-12-23 DIAGNOSIS — Z3490 Encounter for supervision of normal pregnancy, unspecified, unspecified trimester: Secondary | ICD-10-CM

## 2013-12-23 LAB — POCT URINALYSIS DIP (DEVICE)
Bilirubin Urine: NEGATIVE
Glucose, UA: NEGATIVE mg/dL
HGB URINE DIPSTICK: NEGATIVE
Ketones, ur: NEGATIVE mg/dL
Nitrite: NEGATIVE
PROTEIN: NEGATIVE mg/dL
SPECIFIC GRAVITY, URINE: 1.01 (ref 1.005–1.030)
UROBILINOGEN UA: 0.2 mg/dL (ref 0.0–1.0)
pH: 6.5 (ref 5.0–8.0)

## 2013-12-23 LAB — OB RESULTS CONSOLE GC/CHLAMYDIA
Chlamydia: NEGATIVE
Gonorrhea: NEGATIVE

## 2013-12-23 LAB — OB RESULTS CONSOLE GBS: STREP GROUP B AG: NEGATIVE

## 2013-12-23 NOTE — Progress Notes (Signed)
P=67 Occassional edema in hands/feet

## 2013-12-23 NOTE — Progress Notes (Signed)
Doing well. Cultures done today.

## 2013-12-23 NOTE — Patient Instructions (Signed)
Third Trimester of Pregnancy  The third trimester is from week 29 through week 42, months 7 through 9. The third trimester is a time when the fetus is growing rapidly. At the end of the ninth month, the fetus is about 20 inches in length and weighs 6 10 pounds.   BODY CHANGES  Your body goes through many changes during pregnancy. The changes vary from woman to woman.    Your weight will continue to increase. You can expect to gain 25 35 pounds (11 16 kg) by the end of the pregnancy.   You may begin to get stretch marks on your hips, abdomen, and breasts.   You may urinate more often because the fetus is moving lower into your pelvis and pressing on your bladder.   You may develop or continue to have heartburn as a result of your pregnancy.   You may develop constipation because certain hormones are causing the muscles that push waste through your intestines to slow down.   You may develop hemorrhoids or swollen, bulging veins (varicose veins).   You may have pelvic pain because of the weight gain and pregnancy hormones relaxing your joints between the bones in your pelvis. Back aches may result from over exertion of the muscles supporting your posture.   Your breasts will continue to grow and be tender. A yellow discharge may leak from your breasts called colostrum.   Your belly button may stick out.   You may feel short of breath because of your expanding uterus.   You may notice the fetus "dropping," or moving lower in your abdomen.   You may have a bloody mucus discharge. This usually occurs a few days to a week before labor begins.   Your cervix becomes thin and soft (effaced) near your due date.  WHAT TO EXPECT AT YOUR PRENATAL EXAMS   You will have prenatal exams every 2 weeks until week 36. Then, you will have weekly prenatal exams. During a routine prenatal visit:   You will be weighed to make sure you and the fetus are growing normally.   Your blood pressure is taken.   Your abdomen will be  measured to track your baby's growth.   The fetal heartbeat will be listened to.   Any test results from the previous visit will be discussed.   You may have a cervical check near your due date to see if you have effaced.  At around 36 weeks, your caregiver will check your cervix. At the same time, your caregiver will also perform a test on the secretions of the vaginal tissue. This test is to determine if a type of bacteria, Group B streptococcus, is present. Your caregiver will explain this further.  Your caregiver may ask you:   What your birth plan is.   How you are feeling.   If you are feeling the baby move.   If you have had any abnormal symptoms, such as leaking fluid, bleeding, severe headaches, or abdominal cramping.   If you have any questions.  Other tests or screenings that may be performed during your third trimester include:   Blood tests that check for low iron levels (anemia).   Fetal testing to check the health, activity level, and growth of the fetus. Testing is done if you have certain medical conditions or if there are problems during the pregnancy.  FALSE LABOR  You may feel small, irregular contractions that eventually go away. These are called Braxton Hicks contractions, or   false labor. Contractions may last for hours, days, or even weeks before true labor sets in. If contractions come at regular intervals, intensify, or become painful, it is best to be seen by your caregiver.   SIGNS OF LABOR    Menstrual-like cramps.   Contractions that are 5 minutes apart or less.   Contractions that start on the top of the uterus and spread down to the lower abdomen and back.   A sense of increased pelvic pressure or back pain.   A watery or bloody mucus discharge that comes from the vagina.  If you have any of these signs before the 37th week of pregnancy, call your caregiver right away. You need to go to the hospital to get checked immediately.  HOME CARE INSTRUCTIONS    Avoid all  smoking, herbs, alcohol, and unprescribed drugs. These chemicals affect the formation and growth of the baby.   Follow your caregiver's instructions regarding medicine use. There are medicines that are either safe or unsafe to take during pregnancy.   Exercise only as directed by your caregiver. Experiencing uterine cramps is a good sign to stop exercising.   Continue to eat regular, healthy meals.   Wear a good support bra for breast tenderness.   Do not use hot tubs, steam rooms, or saunas.   Wear your seat belt at all times when driving.   Avoid raw meat, uncooked cheese, cat litter boxes, and soil used by cats. These carry germs that can cause birth defects in the baby.   Take your prenatal vitamins.   Try taking a stool softener (if your caregiver approves) if you develop constipation. Eat more high-fiber foods, such as fresh vegetables or fruit and whole grains. Drink plenty of fluids to keep your urine clear or pale yellow.   Take warm sitz baths to soothe any pain or discomfort caused by hemorrhoids. Use hemorrhoid cream if your caregiver approves.   If you develop varicose veins, wear support hose. Elevate your feet for 15 minutes, 3 4 times a day. Limit salt in your diet.   Avoid heavy lifting, wear low heal shoes, and practice good posture.   Rest a lot with your legs elevated if you have leg cramps or low back pain.   Visit your dentist if you have not gone during your pregnancy. Use a soft toothbrush to brush your teeth and be gentle when you floss.   A sexual relationship may be continued unless your caregiver directs you otherwise.   Do not travel far distances unless it is absolutely necessary and only with the approval of your caregiver.   Take prenatal classes to understand, practice, and ask questions about the labor and delivery.   Make a trial run to the hospital.   Pack your hospital bag.   Prepare the baby's nursery.   Continue to go to all your prenatal visits as directed  by your caregiver.  SEEK MEDICAL CARE IF:   You are unsure if you are in labor or if your water has broken.   You have dizziness.   You have mild pelvic cramps, pelvic pressure, or nagging pain in your abdominal area.   You have persistent nausea, vomiting, or diarrhea.   You have a bad smelling vaginal discharge.   You have pain with urination.  SEEK IMMEDIATE MEDICAL CARE IF:    You have a fever.   You are leaking fluid from your vagina.   You have spotting or bleeding from your vagina.     You have severe abdominal cramping or pain.   You have rapid weight loss or gain.   You have shortness of breath with chest pain.   You notice sudden or extreme swelling of your face, hands, ankles, feet, or legs.   You have not felt your baby move in over an hour.   You have severe headaches that do not go away with medicine.   You have vision changes.  Document Released: 08/21/2001 Document Revised: 04/29/2013 Document Reviewed: 10/28/2012  ExitCare Patient Information 2014 [employer].

## 2013-12-24 LAB — GC/CHLAMYDIA PROBE AMP
CT Probe RNA: NEGATIVE
GC Probe RNA: NEGATIVE

## 2013-12-26 LAB — CULTURE, STREPTOCOCCUS GRP B W/SUSCEPT

## 2014-01-01 ENCOUNTER — Ambulatory Visit (INDEPENDENT_AMBULATORY_CARE_PROVIDER_SITE_OTHER): Payer: Medicaid Other | Admitting: Advanced Practice Midwife

## 2014-01-01 ENCOUNTER — Encounter: Payer: Self-pay | Admitting: Advanced Practice Midwife

## 2014-01-01 VITALS — BP 118/83 | Wt 166.0 lb

## 2014-01-01 DIAGNOSIS — Z3480 Encounter for supervision of other normal pregnancy, unspecified trimester: Secondary | ICD-10-CM

## 2014-01-01 DIAGNOSIS — Z23 Encounter for immunization: Secondary | ICD-10-CM

## 2014-01-01 DIAGNOSIS — Z3490 Encounter for supervision of normal pregnancy, unspecified, unspecified trimester: Secondary | ICD-10-CM

## 2014-01-01 LAB — POCT URINALYSIS DIP (DEVICE)
BILIRUBIN URINE: NEGATIVE
Glucose, UA: NEGATIVE mg/dL
HGB URINE DIPSTICK: NEGATIVE
Ketones, ur: NEGATIVE mg/dL
Nitrite: NEGATIVE
PH: 6.5 (ref 5.0–8.0)
Protein, ur: NEGATIVE mg/dL
Specific Gravity, Urine: 1.01 (ref 1.005–1.030)
Urobilinogen, UA: 0.2 mg/dL (ref 0.0–1.0)

## 2014-01-01 MED ORDER — TETANUS-DIPHTH-ACELL PERTUSSIS 5-2.5-18.5 LF-MCG/0.5 IM SUSP
0.5000 mL | Freq: Once | INTRAMUSCULAR | Status: AC
  Administered 2014-01-01: 0.5 mL via INTRAMUSCULAR

## 2014-01-01 NOTE — Patient Instructions (Signed)
Third Trimester of Pregnancy  The third trimester is from week 29 through week 42, months 7 through 9. The third trimester is a time when the fetus is growing rapidly. At the end of the ninth month, the fetus is about 20 inches in length and weighs 6 10 pounds.   BODY CHANGES  Your body goes through many changes during pregnancy. The changes vary from woman to woman.    Your weight will continue to increase. You can expect to gain 25 35 pounds (11 16 kg) by the end of the pregnancy.   You may begin to get stretch marks on your hips, abdomen, and breasts.   You may urinate more often because the fetus is moving lower into your pelvis and pressing on your bladder.   You may develop or continue to have heartburn as a result of your pregnancy.   You may develop constipation because certain hormones are causing the muscles that push waste through your intestines to slow down.   You may develop hemorrhoids or swollen, bulging veins (varicose veins).   You may have pelvic pain because of the weight gain and pregnancy hormones relaxing your joints between the bones in your pelvis. Back aches may result from over exertion of the muscles supporting your posture.   Your breasts will continue to grow and be tender. A yellow discharge may leak from your breasts called colostrum.   Your belly button may stick out.   You may feel short of breath because of your expanding uterus.   You may notice the fetus "dropping," or moving lower in your abdomen.   You may have a bloody mucus discharge. This usually occurs a few days to a week before labor begins.   Your cervix becomes thin and soft (effaced) near your due date.  WHAT TO EXPECT AT YOUR PRENATAL EXAMS   You will have prenatal exams every 2 weeks until week 36. Then, you will have weekly prenatal exams. During a routine prenatal visit:   You will be weighed to make sure you and the fetus are growing normally.   Your blood pressure is taken.   Your abdomen will be  measured to track your baby's growth.   The fetal heartbeat will be listened to.   Any test results from the previous visit will be discussed.   You may have a cervical check near your due date to see if you have effaced.  At around 36 weeks, your caregiver will check your cervix. At the same time, your caregiver will also perform a test on the secretions of the vaginal tissue. This test is to determine if a type of bacteria, Group B streptococcus, is present. Your caregiver will explain this further.  Your caregiver may ask you:   What your birth plan is.   How you are feeling.   If you are feeling the baby move.   If you have had any abnormal symptoms, such as leaking fluid, bleeding, severe headaches, or abdominal cramping.   If you have any questions.  Other tests or screenings that may be performed during your third trimester include:   Blood tests that check for low iron levels (anemia).   Fetal testing to check the health, activity level, and growth of the fetus. Testing is done if you have certain medical conditions or if there are problems during the pregnancy.  FALSE LABOR  You may feel small, irregular contractions that eventually go away. These are called Braxton Hicks contractions, or   false labor. Contractions may last for hours, days, or even weeks before true labor sets in. If contractions come at regular intervals, intensify, or become painful, it is best to be seen by your caregiver.   SIGNS OF LABOR    Menstrual-like cramps.   Contractions that are 5 minutes apart or less.   Contractions that start on the top of the uterus and spread down to the lower abdomen and back.   A sense of increased pelvic pressure or back pain.   A watery or bloody mucus discharge that comes from the vagina.  If you have any of these signs before the 37th week of pregnancy, call your caregiver right away. You need to go to the hospital to get checked immediately.  HOME CARE INSTRUCTIONS    Avoid all  smoking, herbs, alcohol, and unprescribed drugs. These chemicals affect the formation and growth of the baby.   Follow your caregiver's instructions regarding medicine use. There are medicines that are either safe or unsafe to take during pregnancy.   Exercise only as directed by your caregiver. Experiencing uterine cramps is a good sign to stop exercising.   Continue to eat regular, healthy meals.   Wear a good support bra for breast tenderness.   Do not use hot tubs, steam rooms, or saunas.   Wear your seat belt at all times when driving.   Avoid raw meat, uncooked cheese, cat litter boxes, and soil used by cats. These carry germs that can cause birth defects in the baby.   Take your prenatal vitamins.   Try taking a stool softener (if your caregiver approves) if you develop constipation. Eat more high-fiber foods, such as fresh vegetables or fruit and whole grains. Drink plenty of fluids to keep your urine clear or pale yellow.   Take warm sitz baths to soothe any pain or discomfort caused by hemorrhoids. Use hemorrhoid cream if your caregiver approves.   If you develop varicose veins, wear support hose. Elevate your feet for 15 minutes, 3 4 times a day. Limit salt in your diet.   Avoid heavy lifting, wear low heal shoes, and practice good posture.   Rest a lot with your legs elevated if you have leg cramps or low back pain.   Visit your dentist if you have not gone during your pregnancy. Use a soft toothbrush to brush your teeth and be gentle when you floss.   A sexual relationship may be continued unless your caregiver directs you otherwise.   Do not travel far distances unless it is absolutely necessary and only with the approval of your caregiver.   Take prenatal classes to understand, practice, and ask questions about the labor and delivery.   Make a trial run to the hospital.   Pack your hospital bag.   Prepare the baby's nursery.   Continue to go to all your prenatal visits as directed  by your caregiver.  SEEK MEDICAL CARE IF:   You are unsure if you are in labor or if your water has broken.   You have dizziness.   You have mild pelvic cramps, pelvic pressure, or nagging pain in your abdominal area.   You have persistent nausea, vomiting, or diarrhea.   You have a bad smelling vaginal discharge.   You have pain with urination.  SEEK IMMEDIATE MEDICAL CARE IF:    You have a fever.   You are leaking fluid from your vagina.   You have spotting or bleeding from your vagina.     You have severe abdominal cramping or pain.   You have rapid weight loss or gain.   You have shortness of breath with chest pain.   You notice sudden or extreme swelling of your face, hands, ankles, feet, or legs.   You have not felt your baby move in over an hour.   You have severe headaches that do not go away with medicine.   You have vision changes.  Document Released: 08/21/2001 Document Revised: 04/29/2013 Document Reviewed: 10/28/2012  ExitCare Patient Information 2014 [employer].

## 2014-01-01 NOTE — Progress Notes (Signed)
Feels well. Labor precautions reviewed

## 2014-01-08 ENCOUNTER — Ambulatory Visit (INDEPENDENT_AMBULATORY_CARE_PROVIDER_SITE_OTHER): Payer: Self-pay | Admitting: Advanced Practice Midwife

## 2014-01-08 VITALS — BP 120/86 | Temp 97.7°F | Wt 167.2 lb

## 2014-01-08 DIAGNOSIS — Z3480 Encounter for supervision of other normal pregnancy, unspecified trimester: Secondary | ICD-10-CM

## 2014-01-08 DIAGNOSIS — Z3490 Encounter for supervision of normal pregnancy, unspecified, unspecified trimester: Secondary | ICD-10-CM

## 2014-01-08 LAB — POCT URINALYSIS DIP (DEVICE)
BILIRUBIN URINE: NEGATIVE
Glucose, UA: NEGATIVE mg/dL
Hgb urine dipstick: NEGATIVE
Ketones, ur: NEGATIVE mg/dL
Nitrite: NEGATIVE
PROTEIN: NEGATIVE mg/dL
SPECIFIC GRAVITY, URINE: 1.01 (ref 1.005–1.030)
Urobilinogen, UA: 0.2 mg/dL (ref 0.0–1.0)
pH: 6.5 (ref 5.0–8.0)

## 2014-01-08 NOTE — Progress Notes (Signed)
Doing well.  Good fetal movement, denies vaginal bleeding, LOF, regular contractions.  Reviewed signs of labor, reasons to come to hospital.   

## 2014-01-08 NOTE — Patient Instructions (Signed)
[facility] Guide (Revised August 2014)            No Primary Care Doctor:  To locate a primary care doctor that accepts your insurance or provides certain services:           [place] Connect: [phone]           Physician Referral Service: [phone] ask for "My Hall"   If no insurance, you need to see if you qualify for [facility] "orange card", call to set      up appointment for eligibility/enrollment at [phone] or [phone] or visit [facility]. of Health and [employer] ([address], [place] and [address] -[place]) to meet with a [facility] enrollment specialist.  Agencies that provide inexpensive (sliding fee scale) medical care:        [facility] - Family Medicine at [place] - [phone]      [facility]  -  [facility] Adult Center [phone]      [facility] Internal Medicine - [phone]      [facility] Care & Wellness - [phone]      [facility] [phone]      [facility] [phone]   [facility] - [facility] Child Health @ [place] [phone]   [facility] - [facility] Health @ [place] - [phone]   [facility] Family Practice: [phone]    Women's Clinic: [phone]    Planned Parenthood: [phone]    [facility] of the [place]    Medicaid-accepting [facility] Providers:           [facility] [phone] (No Family Planning accepted)          [address], [phone], Mon-Fri 9am-5pm          [facility] - [phone]   [address] [place], [address] [place], Mon-Thursday 8am-5pm, Fri 8am-noon   [employer] - [phone]          [address], [address], Mon-Fri 7:30am-4:30pm          [facility] - [phone]          [address] [place] 8am-5pm          [facility] - [phone] [address]          Only accepts [place] [employer] patients after they have their name applied to their card  Self Pay (no insurance) in [facility]:           Sickle Cell Patients:    [address] [place], [phone] [facility] Internal Medicine:   [address], [place] [phone]       [facility] and [facility]   [address] [place] [address], [place] [phone]  [facility] Family [facility]:   [address], [phone]          [facility]           [address] [place], [phone] [facility] for Children   [address] [place], [phone]           [facility]  [address], [place] [phone]        [facility] - [address]           [phone], Mon-Fri 9am-7pm, [place] 9am-1pm          [facility] - [facility] @ [facility]          [address], [phone]          [facility] - [facility]           [address], [phone] [facility] - [facility] - [place]   [address], [place] [phone]          [facility]           [address] [place] [address], [phone]  [facility] - [facility]    [address], [phone] [facility] - [facility]   [address], [phone]  Dr. Julio Sickssei-Bonsu           [address], [place], [phone]          [facility]           [address], [phone]          [facility] [place]             [address], [phone]          [facility]           [address] [place], [phone], 1st & 3rd Saturday every month, 10am-1pm  OTHERS:  [employer]  (Immigration  [facility] Only)  [phone] (Thursday only)  Strategies for finding a Primary Care Provider:  1) Find a Doctor and Pay Out of Pocket  Although you won't have to find out who is covered by your insurance plan, it is a good idea to ask around and get recommendations. You will then need to call the office and see if the doctor you have chosen will accept you as a new patient and what types of options they offer for patients who are self-pay. Some doctors offer discounts or will set up payment plans for their patients who do not have insurance, but you will need to ask so you aren't surprised when you get to your appointment.  2) Contact [facility] - To see if you qualify for "orange card" access to healthcare safety net providers.  Call for appointment for eligibility/enrollment at [phone] or [phone]. (Uninsured, 0-200% [employer], qualifying info)  Applicants for [facility] are first required to see if they are eligible to enroll in the [facility] Marketplace before enrolling in [facility] (and get an exemption if they are not).  GCCN Criteria for acceptance is:    Proof of Engineering geologist[employer] Marketing exemption - form or documentation    Valid photo ID (driver's license, state identification card, passport, home country ID)    Proof of [facility] residency (e.g. driver's license, lease/landlord information, pay stubs with address, utility bill, bank statement, etc.)    Proof of income (1040, last year's tax return, W2, 4 current pay stubs, other income proof)    Proof of assets (current bank statement + 3 most recent, disability paperwork, life insurance info, tax value on autos, etc.)  3) Contact  Your Local Health Department  Not all health departments have doctors that can see patients for sick visits, but many do, so it is worth a call to see if yours does. If you don't know where your local health department is, you can check in your phone book. The CDC also has a tool to help you locate your  state's health department, and many state websites also have listings of all of their local health departments.  4) Find a Walk-in Clinic  If your illness is not likely to be very severe or complicated, you may want to try a walk in clinic. These are popping up all over the country in pharmacies, drugstores, and shopping centers. They're usually staffed by nurse practitioners or physician assistants that have been trained to treat common illnesses and complaints. They're usually fairly quick and inexpensive. However, if you have serious medical issues or chronic medical problems, these are probably not your best option

## 2014-01-10 ENCOUNTER — Encounter (HOSPITAL_COMMUNITY): Payer: Medicaid Other | Admitting: Anesthesiology

## 2014-01-10 ENCOUNTER — Encounter (HOSPITAL_COMMUNITY): Payer: Self-pay | Admitting: *Deleted

## 2014-01-10 ENCOUNTER — Inpatient Hospital Stay (HOSPITAL_COMMUNITY): Payer: Medicaid Other | Admitting: Anesthesiology

## 2014-01-10 ENCOUNTER — Inpatient Hospital Stay (HOSPITAL_COMMUNITY)
Admission: AD | Admit: 2014-01-10 | Discharge: 2014-01-11 | DRG: 775 | Disposition: A | Discharge status: Home / Self Care | Payer: Medicaid Other | Source: Ambulatory Visit | Attending: Obstetrics & Gynecology | Admitting: Obstetrics & Gynecology

## 2014-01-10 DIAGNOSIS — Z833 Family history of diabetes mellitus: Secondary | ICD-10-CM

## 2014-01-10 DIAGNOSIS — Z3490 Encounter for supervision of normal pregnancy, unspecified, unspecified trimester: Secondary | ICD-10-CM

## 2014-01-10 DIAGNOSIS — Z823 Family history of stroke: Secondary | ICD-10-CM

## 2014-01-10 DIAGNOSIS — Z8249 Family history of ischemic heart disease and other diseases of the circulatory system: Secondary | ICD-10-CM

## 2014-01-10 DIAGNOSIS — IMO0001 Reserved for inherently not codable concepts without codable children: Secondary | ICD-10-CM

## 2014-01-10 LAB — CBC
HEMATOCRIT: 32.7 % — AB (ref 36.0–46.0)
Hemoglobin: 11.4 g/dL — ABNORMAL LOW (ref 12.0–15.0)
MCH: 29.8 pg (ref 26.0–34.0)
MCHC: 34.9 g/dL (ref 30.0–36.0)
MCV: 85.6 fL (ref 78.0–100.0)
PLATELETS: 209 10*3/uL (ref 150–400)
RBC: 3.82 MIL/uL — ABNORMAL LOW (ref 3.87–5.11)
RDW: 13.5 % (ref 11.5–15.5)
WBC: 10.1 10*3/uL (ref 4.0–10.5)

## 2014-01-10 MED ORDER — OXYTOCIN 40 UNITS IN LACTATED RINGERS INFUSION - SIMPLE MED
62.5000 mL/h | INTRAVENOUS | Status: DC
  Administered 2014-01-10: 62.5 mL/h via INTRAVENOUS
  Filled 2014-01-10: qty 1000

## 2014-01-10 MED ORDER — DIBUCAINE 1 % RE OINT: 1.0000 "application " | TOPICAL_OINTMENT | RECTAL | Status: DC | PRN

## 2014-01-10 MED ORDER — ONDANSETRON HCL 4 MG/2ML IJ SOLN: 4.0000 mg | Freq: Four times a day (QID) | INTRAMUSCULAR | Status: DC | PRN

## 2014-01-10 MED ORDER — OXYCODONE-ACETAMINOPHEN 5-325 MG PO TABS
1.0000 | ORAL_TABLET | ORAL | Status: DC | PRN
  Administered 2014-01-10: 1 via ORAL
  Filled 2014-01-10: qty 1

## 2014-01-10 MED ORDER — WITCH HAZEL-GLYCERIN EX PADS: 1.0000 "application " | MEDICATED_PAD | CUTANEOUS | Status: DC | PRN

## 2014-01-10 MED ORDER — LACTATED RINGERS IV SOLN
INTRAVENOUS | Status: DC
  Administered 2014-01-10 (×2): via INTRAVENOUS

## 2014-01-10 MED ORDER — FENTANYL CITRATE 0.05 MG/ML IJ SOLN: 100.0000 ug | INTRAMUSCULAR | Status: DC | PRN

## 2014-01-10 MED ORDER — LANOLIN HYDROUS EX OINT: TOPICAL_OINTMENT | CUTANEOUS | Status: DC | PRN

## 2014-01-10 MED ORDER — SENNOSIDES-DOCUSATE SODIUM 8.6-50 MG PO TABS
2.0000 | ORAL_TABLET | ORAL | Status: DC
  Administered 2014-01-10: 2 via ORAL
  Filled 2014-01-10: qty 2

## 2014-01-10 MED ORDER — ACETAMINOPHEN 325 MG PO TABS: 650.0000 mg | ORAL_TABLET | ORAL | Status: DC | PRN

## 2014-01-10 MED ORDER — LACTATED RINGERS IV SOLN
500.0000 mL | Freq: Once | INTRAVENOUS | Status: AC
  Administered 2014-01-10: 500 mL via INTRAVENOUS

## 2014-01-10 MED ORDER — LIDOCAINE HCL (PF) 1 % IJ SOLN
30.0000 mL | INTRAMUSCULAR | Status: DC | PRN
  Administered 2014-01-10: 30 mL via SUBCUTANEOUS
  Filled 2014-01-10: qty 30

## 2014-01-10 MED ORDER — IBUPROFEN 600 MG PO TABS
600.0000 mg | ORAL_TABLET | Freq: Four times a day (QID) | ORAL | Status: DC
  Administered 2014-01-10 – 2014-01-11 (×4): 600 mg via ORAL
  Filled 2014-01-10 (×4): qty 1

## 2014-01-10 MED ORDER — PHENYLEPHRINE 40 MCG/ML (10ML) SYRINGE FOR IV PUSH (FOR BLOOD PRESSURE SUPPORT)
80.0000 ug | PREFILLED_SYRINGE | INTRAVENOUS | Status: DC | PRN
  Filled 2014-01-10: qty 10
  Filled 2014-01-10: qty 2

## 2014-01-10 MED ORDER — OXYTOCIN BOLUS FROM INFUSION: 500.0000 mL | INTRAVENOUS | Status: DC

## 2014-01-10 MED ORDER — DIPHENHYDRAMINE HCL 25 MG PO CAPS: 25.0000 mg | ORAL_CAPSULE | Freq: Four times a day (QID) | ORAL | Status: DC | PRN

## 2014-01-10 MED ORDER — PHENYLEPHRINE 40 MCG/ML (10ML) SYRINGE FOR IV PUSH (FOR BLOOD PRESSURE SUPPORT)
80.0000 ug | PREFILLED_SYRINGE | INTRAVENOUS | Status: DC | PRN
  Filled 2014-01-10: qty 2

## 2014-01-10 MED ORDER — ONDANSETRON HCL 4 MG PO TABS
4.0000 mg | ORAL_TABLET | ORAL | Status: DC | PRN
Start: 2014-01-10 — End: 2014-01-11

## 2014-01-10 MED ORDER — EPHEDRINE 5 MG/ML INJ
10.0000 mg | INTRAVENOUS | Status: DC | PRN
  Filled 2014-01-10: qty 2
  Filled 2014-01-10: qty 4

## 2014-01-10 MED ORDER — MEASLES, MUMPS & RUBELLA VAC ~~LOC~~ INJ
0.5000 mL | INJECTION | Freq: Once | SUBCUTANEOUS | Status: DC
  Filled 2014-01-10: qty 0.5

## 2014-01-10 MED ORDER — TETANUS-DIPHTH-ACELL PERTUSSIS 5-2.5-18.5 LF-MCG/0.5 IM SUSP: 0.5000 mL | Freq: Once | INTRAMUSCULAR | Status: DC

## 2014-01-10 MED ORDER — ONDANSETRON HCL 4 MG/2ML IJ SOLN: 4.0000 mg | INTRAMUSCULAR | Status: DC | PRN

## 2014-01-10 MED ORDER — LACTATED RINGERS IV SOLN: 500.0000 mL | INTRAVENOUS | Status: DC | PRN

## 2014-01-10 MED ORDER — DIPHENHYDRAMINE HCL 50 MG/ML IJ SOLN: 12.5000 mg | INTRAMUSCULAR | Status: DC | PRN

## 2014-01-10 MED ORDER — SIMETHICONE 80 MG PO CHEW: 80.0000 mg | CHEWABLE_TABLET | ORAL | Status: DC | PRN

## 2014-01-10 MED ORDER — BENZOCAINE-MENTHOL 20-0.5 % EX AERO
1.0000 "application " | INHALATION_SPRAY | CUTANEOUS | Status: DC | PRN
  Administered 2014-01-10: 1 via TOPICAL
  Filled 2014-01-10: qty 56

## 2014-01-10 MED ORDER — EPHEDRINE 5 MG/ML INJ
10.0000 mg | INTRAVENOUS | Status: DC | PRN
  Filled 2014-01-10: qty 2

## 2014-01-10 MED ORDER — FENTANYL 2.5 MCG/ML BUPIVACAINE 1/10 % EPIDURAL INFUSION (WH - ANES)
14.0000 mL/h | INTRAMUSCULAR | Status: DC | PRN
  Administered 2014-01-10: 14 mL/h via EPIDURAL
  Filled 2014-01-10: qty 125

## 2014-01-10 MED ORDER — OXYCODONE-ACETAMINOPHEN 5-325 MG PO TABS: 1.0000 | ORAL_TABLET | ORAL | Status: DC | PRN

## 2014-01-10 MED ORDER — CITRIC ACID-SODIUM CITRATE 334-500 MG/5ML PO SOLN
30.0000 mL | ORAL | Status: DC | PRN
  Filled 2014-01-10: qty 15

## 2014-01-10 MED ORDER — FLEET ENEMA 7-19 GM/118ML RE ENEM: 1.0000 | ENEMA | RECTAL | Status: DC | PRN

## 2014-01-10 MED ORDER — ZOLPIDEM TARTRATE 5 MG PO TABS
5.0000 mg | ORAL_TABLET | Freq: Every evening | ORAL | Status: DC | PRN
Start: 2014-01-10 — End: 2014-01-11

## 2014-01-10 MED ORDER — IBUPROFEN 600 MG PO TABS
600.0000 mg | ORAL_TABLET | Freq: Four times a day (QID) | ORAL | Status: DC | PRN
Start: 2014-01-10 — End: 2014-01-10

## 2014-01-10 MED ORDER — PRENATAL MULTIVITAMIN CH
1.0000 | ORAL_TABLET | Freq: Every day | ORAL | Status: DC
  Administered 2014-01-10 – 2014-01-11 (×2): 1 via ORAL
  Filled 2014-01-10 (×2): qty 1

## 2014-01-10 MED ORDER — LIDOCAINE HCL (PF) 1 % IJ SOLN
INTRAMUSCULAR | Status: DC | PRN
  Administered 2014-01-10 (×2): 5 mL

## 2014-01-10 NOTE — H&P (Signed)
Margaret [name] is a 23 y.o. G1P0 female at 3648w4d by LMP c/w 19.4wk u/s, presenting initially for RN labor check- cx was 1/60/-3 on arrival, changed to 2/80/-2 and had SROM of clear fluid at 0445.  Reports active fetal movement, contractions: regular, every 3-5 minutes since ~0030, vaginal bleeding: none, membranes: ruptured, clear fluid. [facility].    Prenatal History/Complications: none  Past Medical History: History reviewed. No pertinent past medical history.  Past Surgical History: History reviewed. No pertinent past surgical history.  Obstetrical History: OB History   Grav Para Term Preterm Abortions TAB SAB Ect Mult Living   1               Social History: History   Social History  . Marital Status: Single    Spouse Name: N/A    Number of Children: N/A  . Years of Education: N/A   Social History Main Topics  . Smoking status: Never Smoker   . Smokeless tobacco: None  . Alcohol Use: No  . Drug Use: No  . Sexual Activity: Yes    Birth Control/ Protection: None   Other Topics Concern  . None   Social History Narrative  . None    Family History: Family History  Problem Relation Age of Onset  . Hypertension Mother   . Diabetes Father   . Stroke Maternal Grandmother   . Cancer Maternal Grandfather   . Cancer Paternal Grandmother     Allergies: No Known Allergies  Prescriptions prior to admission  Medication Sig Dispense Refill  . acetaminophen (TYLENOL) 500 MG tablet Take 1,000 mg by mouth every 6 (six) hours as needed.      . Prenatal Vit-Fe Fumarate-FA (MULTIVITAMIN-PRENATAL) 27-0.8 MG TABS tablet Take 1 tablet by mouth daily at 12 noon.         Review of Systems  Pertinent pos/neg as indicated in HPI    Blood pressure 132/82, pulse 82, temperature 98.2 F (36.8 C), temperature source Oral, resp. rate 20, height 5\' 3"  (1.6 m), weight 77.384 kg (170 lb 9.6 oz), last menstrual period 04/15/2013. General appearance:  alert, cooperative and no distress Lungs: clear to auscultation bilaterally Heart: regular rate and rhythm Abdomen: gravid, soft, non-tender Extremities: no edema DTR's 2+  Fetal monitoring: FHR: 130 bpm, variability: moderate,  Accelerations: Present,  decelerations:  Absent Uterine activity: 2-3  Dilation: 2 Effacement (%): 80 Station: -2 Exam by:: Harvin Hazel. Millican, RN  Presentation: cephalic   Prenatal labs: ABO, Rh: A/POS/-- (11/25 1050) Antibody: NEG (11/25 1050) Rubella:   RPR: NON REAC (02/10 1613)  HBsAg: NEGATIVE (11/25 1050)  HIV: NON REACTIVE (02/10 1613)  GBS: Negative (04/15 0000)   1 hr Glucola: 104 Genetic screening:  Quad wnl Anatomy US: normal female  Results for orders placed during the hospital encounter of 01/10/14 (from the past 24 hour(s))  CBC   Collection Time    01/10/14  5:30 AM      Result Value Ref Range   WBC 10.1  4.0 - 10.5 K/uL   RBC 3.82 (*) 3.87 - 5.11 MIL/uL   Hemoglobin 11.4 (*) 12.0 - 15.0 g/dL   HCT 40.932.7 (*) 81.136.0 - 91.446.0 %   MCV 85.6  78.0 - 100.0 fL   MCH 29.8  26.0 - 34.0 pg   MCHC 34.9  30.0 - 36.0 g/dL   RDW 78.213.5  95.611.5 - 21.315.5 %   Platelets 209  150 - 400 K/uL  Assessment:  8382w4d SIUP  G1P0  SROM w/ early labor  Cat 1 FHR  GBS Negative (04/15 0000)  Plan:  Admit to BS  IV pain meds/epidural prn active labor  Expectant management  Anticipate NSVD   Plans to breastfeed  Contraception: undecided  Circumcision: outpatient  Marge DuncansKimberly Randall Booker CNM, WHNP-BC 01/10/2014, 6:30 AM

## 2014-01-10 NOTE — Progress Notes (Signed)
Patient ID: Margaret [name], female   DOB: Jan 19, 1991, 23 y.o.   MRN: [id number] Margaret [name] is a 23 y.o. G1P0 at 3955w4d admitted for rupture of membranes, early labor  Subjective: Comfortable w/ epidural  Objective: BP 120/86  Pulse 102  Temp(Src) 98 F (36.7 C) (Oral)  Resp 18  Ht 5\' 3"  (1.6 m)  Wt 77.384 kg (170 lb 9.6 oz)  BMI 30.23 kg/m2  SpO2 100%  LMP 04/15/2013    FHT:  FHR: 135 bpm, variability: moderate,  accelerations:  Present,  decelerations:  Absent UC:   regular, every 1-2 minutes  SVE:   Dilation: 6 Effacement (%): 90 Station: -1 Exam by:: kbooker, cnm  Labs: Lab Results  Component Value Date   WBC 10.1 01/10/2014   HGB 11.4* 01/10/2014   HCT 32.7* 01/10/2014   MCV 85.6 01/10/2014   PLT 209 01/10/2014    Assessment / Plan: Spontaneous labor, progressing normally  Labor: Progressing normally Fetal Wellbeing:  Category I Pain Control:  Epidural Pre-eclampsia: n/a I/D:  n/a Anticipated MOD:  NSVD  Cheron EveryKimberly Randall Cassiel Fernandez CNM, WHNP-BC 01/10/2014, 7:47 AM

## 2014-01-10 NOTE — Progress Notes (Signed)
Margaret [name] is a 23 y.o. G1P0 at 624w4d  admitted for active labor  Subjective:  Doing well. Starting to feel pressure with contractions. +FM.    Objective: BP 107/72  Pulse 95  Temp(Src) 98.4 F (36.9 C) (Oral)  Resp 20  Ht 5\' 3"  (1.6 m)  Wt 77.384 kg (170 lb 9.6 oz)  BMI 30.23 kg/m2  SpO2 99%  LMP 04/15/2013      FHT:  FHR: 140 bpm, variability: moderate,  accelerations:  Present,  decelerations:  Present early and variable UC:   regular, every 1-3 minutes SVE:   Dilation: Lip/rim Effacement (%): 90 Station: +2 Exam by:: [employer] CorporationSmith RN  Labs: Lab Results  Component Value Date   WBC 10.1 01/10/2014   HGB 11.4* 01/10/2014   HCT 32.7* 01/10/2014   MCV 85.6 01/10/2014   PLT 209 01/10/2014    Assessment / Plan: Spontaneous labor, progressing normally  Labor: Progressing normally Fetal Wellbeing:  Category II Pain Control:  Epidural I/D:  n/a Anticipated MOD:  NSVD  Renalda Locklin L Erbie Arment 01/10/2014, 12:09 PM

## 2014-01-10 NOTE — Consult Note (Signed)
Neonatology Note:   Attendance at Delivery:    I was asked by Dr. Reola CalkinsBeck to attend this vaginal delivery due to [facility]. The mother is a G1P0 A pos, GBS neg with FHR decelerations during final phase of pushing. A vacuum was placed, but delivery finally accomplished without the vacuum. ROM 8 hours prior to delivery, fluid clear. Infant glassy-eyed and with decreased tone, but with good spontaneous cry at birth. Needed bulb suctioning several times for removal of large amounts of very thick, clear mucous. Ap 7/9. Lungs clear to ausc in DR, no distress, and tone recovered to normal by 5 minutes. To CN to care of Pediatrician.   Doretha Souhristie C. Teal Raben, MD

## 2014-01-10 NOTE — MAU Note (Signed)
Contractions for about an hour. Before ctxs started had some vag bleeding. Alittle at first and then a lot. Vomited x 1.

## 2014-01-10 NOTE — Anesthesia Preprocedure Evaluation (Signed)

## 2014-01-10 NOTE — Anesthesia Procedure Notes (Signed)
Epidural Patient location during procedure: OB Start time: 01/10/2014 6:39 AM  Staffing Anesthesiologist: Brayton CavesJACKSON, Tawny Raspberry Performed by: anesthesiologist   Preanesthetic Checklist Completed: patient identified, site marked, surgical consent, pre-op evaluation, timeout performed, IV checked, risks and benefits discussed and monitors and equipment checked  Epidural Patient position: sitting Prep: site prepped and draped and DuraPrep Patient monitoring: continuous pulse ox and blood pressure Approach: midline Location: L3-L4 Injection technique: LOR air  Needle:  Needle type: Tuohy  Needle gauge: 17 G Needle length: 9 cm and 9 Needle insertion depth: 4 cm Catheter type: closed end flexible Catheter size: 19 Gauge Catheter at skin depth: 10 cm Test dose: negative  Assessment Events: blood not aspirated, injection not painful, no injection resistance, negative IV test and no paresthesia  Additional Notes Patient identified.  Risk benefits discussed including failed block, incomplete pain control, headache, nerve damage, paralysis, blood pressure changes, nausea, vomiting, reactions to medication both toxic or allergic, and postpartum back pain.  Patient expressed understanding and wished to proceed.  All questions were answered.  Sterile technique used throughout procedure and epidural site dressed with sterile barrier dressing. No paresthesia or other complications noted.The patient did not experience any signs of intravascular injection such as tinnitus or metallic taste in mouth nor signs of intrathecal spread such as rapid motor block. Please see nursing notes for vital signs. Of note the patient did feel a L sided twinge with threading of the catheter but no pain on needle placement.

## 2014-01-11 LAB — RPR

## 2014-01-11 MED ORDER — IBUPROFEN 600 MG PO TABS: 600.0000 mg | ORAL_TABLET | Freq: Four times a day (QID) | ORAL | Status: DC

## 2014-01-11 NOTE — Discharge Instructions (Signed)

## 2014-01-11 NOTE — Discharge Summary (Signed)
Obstetric Discharge Summary Reason for Admission: onset of labor and rupture of membranes Prenatal Procedures: none Intrapartum Procedures: vacuum Postpartum Procedures: none Complications-Operative and Postpartum: none Hemoglobin  Date Value Ref Range Status  01/10/2014 11.4* 12.0 - 15.0 g/dL Final     HCT  Date Value Ref Range Status  01/10/2014 32.7* 36.0 - 46.0 % Final    Discharge Diagnoses: Term Pregnancy-delivered  Hospital Course:  Margaret [name] is a 23 y.o. G1P1001 who presented with contractions and rupture of the membranes on 01/08/14.  She had a uncomplicated VAVD, see note for details below. She was able to ambulate, tolerate PO and void normally. She was discharged home with instructions for postpartum care.    Operative Delivery Note  At 12:24 PM a viable female was delivered via VAVD. Presentation: vertex; Position: Left,, Occiput,, Anterior; Station: +2.  Verbal consent: obtained from patient. Risks and benefits discussed in detail. Risks include, but are not limited to the risks of anesthesia, bleeding, infection, damage to maternal tissues, fetal cephalhematoma. There is also the risk of inability to effect vaginal delivery of the head, or shoulder dystocia that cannot be resolved by established maneuvers, leading to the need for emergency cesarean section.  APGAR: 7, 9; weight 6 lb 13 oz (3090 g).  Placenta status: Intact, Spontaneous.  Cord: 3 vessels with the following complications: . Cord pH: clotted  Anesthesia: Epidural  Instruments: KIWI vacuum  Episiotomy: None  Lacerations: 1st degree perineal  Suture Repair: 3.0 vicryl  Est. Blood Loss (mL): 300cc  Mom to postpartum. Baby to Couplet care / Skin to Skin.  Called to room for prolonged decel to the 60s into its 4th minute. Pt found to be complete and +2 in OA position. Verbal consent was obtained for an outlet vacuum delivery due to fetal bradycardia. With pt permission the KIWI vacuum was applied with care to  remain anterior to the posterior fontanelle and remain along the sagittal suture. NICU was called for delivery. Pressure was increased to 500 mmHg and pt pushed with good maternal effort with 3 contractions and pulling to bring the head to the perineum. 1 popoff. She then pushed with 2 more contractions without assistance to deliver a liveborn female with spontaneous cry. Baby taken to the warmer where NICU was present and assessed the situation. Placenta delivered intact with 3V cord via traction and pitocin. 1st degree tear requiring 3 sutures for hemostasis. No complications. Mom and baby to postpartum. Baby transferred to maternal abdomen prior to my exit.  Dr. Penne LashLeggett was present for and supervised the delivery of this newborn.  Keli L Beck  01/10/2014, 12:51 PM      Physical Exam:  General: alert, cooperative and no distress Lochia: appropriate Uterine Fundus: firm DVT Evaluation: No evidence of DVT seen on physical exam. Negative Homan's sign. No cords or calf tenderness. No significant calf/ankle edema.  Discharge Information: Date: 01/11/2014 Activity: pelvic rest Diet: routine Medications: motrin, PNV Baby feeding: plans to breastfeed Contraception: pt educated and still deciding on method Condition: stable Instructions: refer to practice specific booklet Discharge to: home   Newborn Data: Live born female  Birth Weight: 6 lb 13 oz (3090 g) APGAR: 7, 9  Home with mom.   Lawernce Pittsrew Venables, PA-S 01/11/2014, 8:54 AM  I spoke with and examined patient and agree with PA-S's note and plan of care.  Tawana ScaleMichael Ryan Nasrin Lanzo, MD Ob Fellow 01/11/2014 9:18 AM

## 2014-01-11 NOTE — Progress Notes (Signed)
UR chart review completed.  

## 2014-01-11 NOTE — Anesthesia Postprocedure Evaluation (Signed)
  Anesthesia Post-op Note  Patient: Lavonda Jumbolicia Lobban  Procedure(s) Performed: * No procedures listed *  Patient Location: Mother/Baby  Anesthesia Type:Epidural  Level of Consciousness: awake, alert , oriented and patient cooperative  Airway and Oxygen Therapy: Patient Spontanous Breathing  Post-op Pain: none  Post-op Assessment: Patient's Cardiovascular Status Stable, Respiratory Function Stable, Patent Airway, No signs of Nausea or vomiting, Adequate PO intake, Pain level controlled, No headache, No backache, No residual numbness and No residual motor weakness  Post-op Vital Signs: Reviewed and stable  Last Vitals:  Filed Vitals:   01/11/14 0629  BP: 122/77  Pulse: 71  Temp: 36.9 C  Resp: 18    Complications: No apparent anesthesia complications

## 2014-01-11 NOTE — Discharge Summary (Signed)
Attestation of Attending Supervision of Obstetric Fellow: Evaluation and management procedures were performed by the Obstetric Fellow under my supervision and collaboration.  I have reviewed the Obstetric Fellow's note and chart, and I agree with the management and plan.  Fusae Florio, MD, FACOG Attending Obstetrician & Gynecologist Faculty Practice, [facility]   

## 2014-01-11 NOTE — Lactation Note (Signed)
This note was copied from the chart of Margaret Lavonda Jumbolicia [place]. Lactation Consultation Note  Patient Name: Margaret [name] [id number]'s Date: 01/11/2014 Reason for consult: Follow-up assessment  Visited with Mom and FOB on day of discharge, baby at 7023 hrs old.  Mom able to latch baby on, needing only a few changes to help baby get a deeper grasp onto areola.  Baby very relaxed with multiple swallowing.  Latch score - 9. Basic teaching reviewed.  Engorgement prevention and treatment discussed.  Recommended continued skin to skin, and cue based feedings.  Reminded Mom of OP lactation services available.  To call for assistance prn.  Consult Status Consult Status: Complete Date: 01/11/14 Follow-up type: Call as needed    Judee ClaraCaroline E Tynika Luddy 01/11/2014, 12:02 PM

## 2014-01-11 NOTE — Lactation Note (Signed)
This note was copied from the chart of Margaret [name]. Lactation Consultation Note First time mom, has large pendulum breast. Encouraged to apply under breast dry wash cloth. In football position assisted mom in BF and latch.  Patient Name: Margaret Lavonda Jumbolicia Shankel Mom encouraged to feed baby w/feeding cuesMom encouraged to feed baby w/feeding cuesMom encouraged to feed baby 8-12 times/24 hours and with feeding cues. Specifics of an asymmetric latch shown. Reviewed Baby & Me book's Breastfeeding Basics. Mother informed of post-discharge support and given phone number to the lactation department, including services for phone call assistance; out-patient appointments; and breastfeeding support group. List of other breastfeeding resources in the community given in the handout. Encouraged mother to call for problems or concerns related to breastfeeding.Mom made aware of O/P services, breastfeeding support groups, community resources, and our phone # for post-discharge questions. Encouraged to call for assistance if needed and to verify proper latch. Today's Date: 01/11/2014 Reason for consult: Initial assessment   Maternal Data    Feeding Feeding Type: Breast Fed Length of feed: 15 min  LATCH Score/Interventions Latch: Repeated attempts needed to sustain latch, nipple held in mouth throughout feeding, stimulation needed to elicit sucking reflex. Intervention(s): Adjust position;Assist with latch;Breast massage;Breast compression  Audible Swallowing: A few with stimulation Intervention(s): Hand expression;Alternate breast massage  Type of Nipple: Everted at rest and after stimulation  Comfort (Breast/Nipple): Soft / non-tender     Hold (Positioning): Assistance needed to correctly position infant at breast and maintain latch.  LATCH Score: 7  Lactation Tools Discussed/Used     Consult Status Consult Status: Follow-up Date: 01/11/14 Follow-up type: In-patient    Charyl DancerLaura G  Holy [place] 01/11/2014, 2:22 AM

## 2014-02-18 ENCOUNTER — Ambulatory Visit (INDEPENDENT_AMBULATORY_CARE_PROVIDER_SITE_OTHER): Payer: Medicaid Other | Admitting: Obstetrics and Gynecology

## 2014-02-18 ENCOUNTER — Encounter: Payer: Self-pay | Admitting: Obstetrics and Gynecology

## 2014-02-18 VITALS — BP 121/80 | HR 72 | Temp 97.9°F | Ht 63.0 in | Wt 145.0 lb

## 2014-02-18 DIAGNOSIS — IMO0001 Reserved for inherently not codable concepts without codable children: Secondary | ICD-10-CM

## 2014-02-18 DIAGNOSIS — Z3049 Encounter for surveillance of other contraceptives: Secondary | ICD-10-CM

## 2014-02-18 LAB — POCT PREGNANCY, URINE: Preg Test, Ur: NEGATIVE

## 2014-02-18 MED ORDER — MEDROXYPROGESTERONE ACETATE 104 MG/0.65ML ~~LOC~~ SUSP
104.0000 mg | Freq: Once | SUBCUTANEOUS | Status: AC
  Administered 2014-02-18: 104 mg via SUBCUTANEOUS

## 2014-02-18 NOTE — Progress Notes (Signed)
  Subjective:     Margaret [name] is a 23 y.o. female who presents for a postpartum visit. She is 5 weeks postpartum following a VAVD. I have fully reviewed the prenatal and intrapartum course. The delivery was at 38 gestational weeks. Outcome: vacuum, outlet. Anesthesia: epidural. Postpartum course has been uncomplicated. Baby's course has been uncomplicated. Baby is feeding by breast. Bleeding staining only. Bowel function is normal. Bladder function is normal. Patient is not sexually active. Contraception method is none. Postpartum depression screening: negative.     Review of Systems A comprehensive review of systems was negative.   Objective:    BP 121/80  Pulse 72  Temp(Src) 97.9 F (36.6 C) (Oral)  Ht 5\' 3"  (1.6 m)  Wt 145 lb (65.772 kg)  BMI 25.69 kg/m2  Breastfeeding? Yes  General:  alert, cooperative and no distress   Breasts:  inspection negative, no nipple discharge or bleeding, no masses or nodularity palpable  Lungs: clear to auscultation bilaterally  Heart:  regular rate and rhythm  Abdomen: soft, non-tender; bowel sounds normal; no masses,  no organomegaly   Vulva:  normal  Vagina: normal vagina, no discharge, exudate, lesion, or erythema  Cervix:  multiparous appearance  Corpus: normal size, contour, position, consistency, mobility, non-tender  Adnexa:  normal adnexa and no mass, fullness, tenderness  Rectal Exam: Not performed.        Assessment:     Normal postpartum exam. Pap smear not done at today's visit.   Plan:    1. Contraception: Depo-Provera injections 2. Patient medically cleared to resume all activities of daily living 3. Follow up in: 3 months for depo-provera and in November for annual exam or as needed.

## 2014-07-12 ENCOUNTER — Encounter: Payer: Self-pay | Admitting: Obstetrics and Gynecology

## 2014-10-21 IMAGING — US US OB COMP +14 WK
1 series · 12 of 28 positions shown · non-contrast
Comparison: none

[Series 1: us ob comp +14 wk · 12 of 63 slices shown]
[im 3/63]
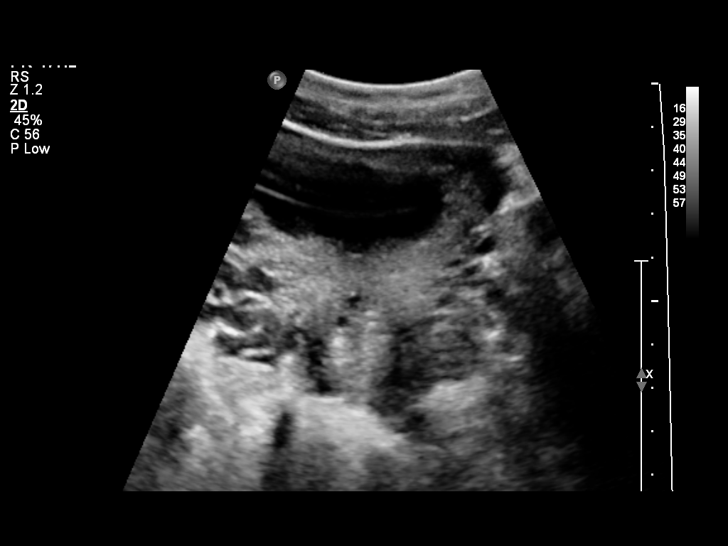
[im 7/63]
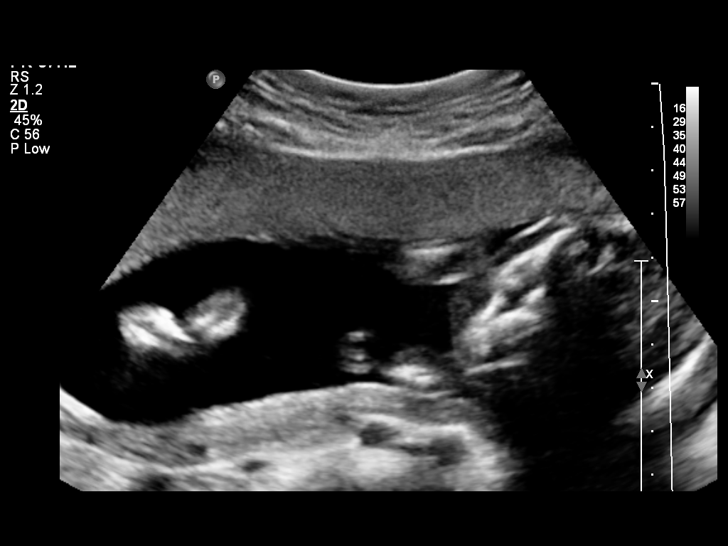
[im 12/63]
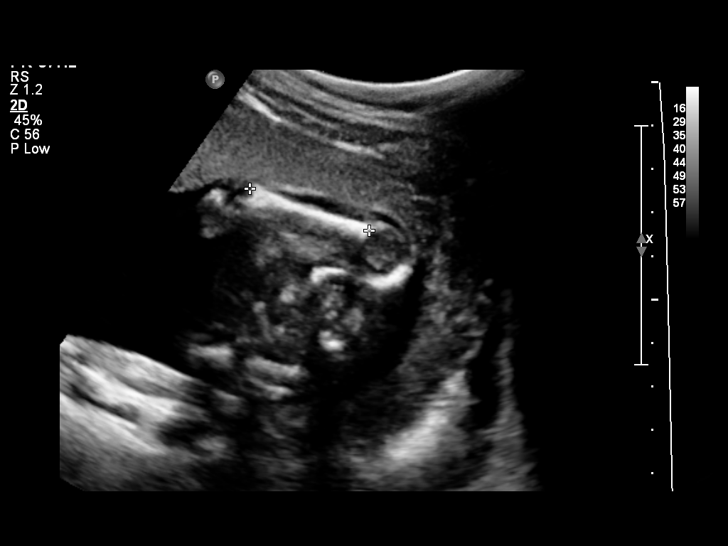
[im 19/63]
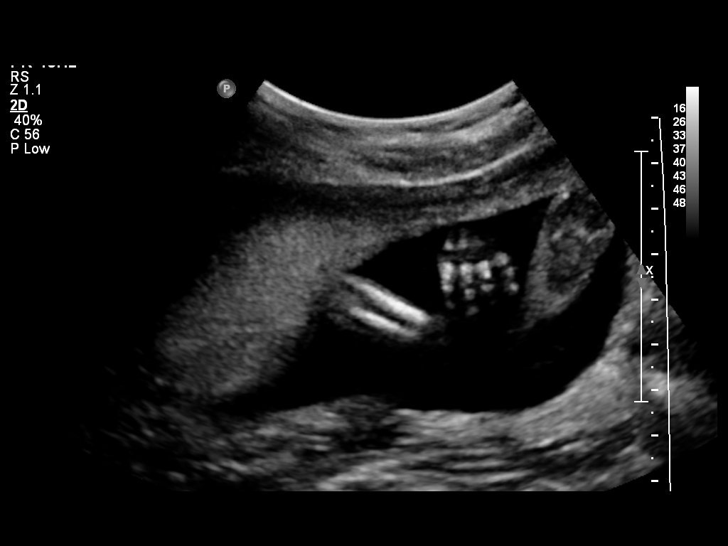
[im 23/63]
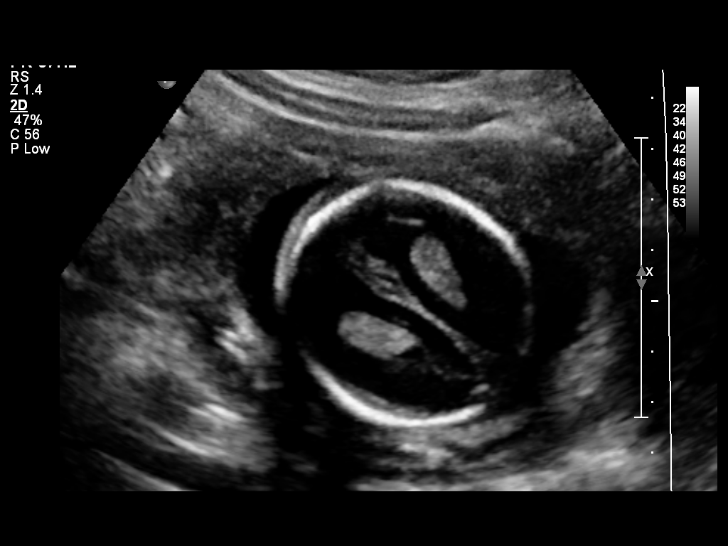
[im 28/63]
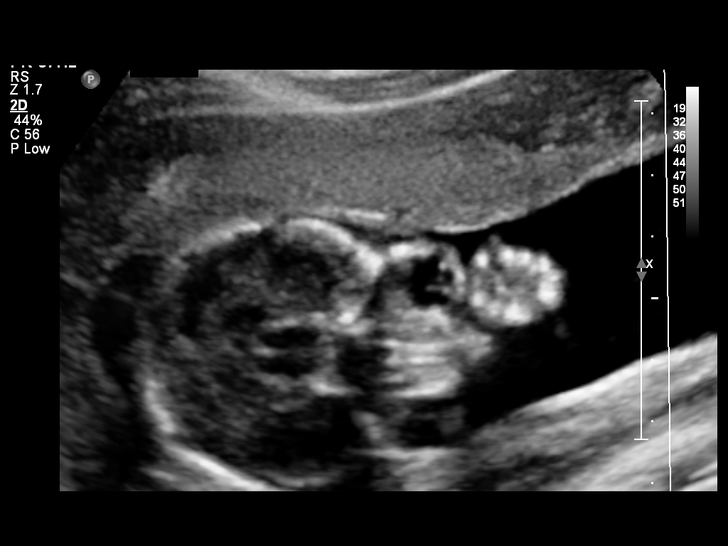
[im 35/63]
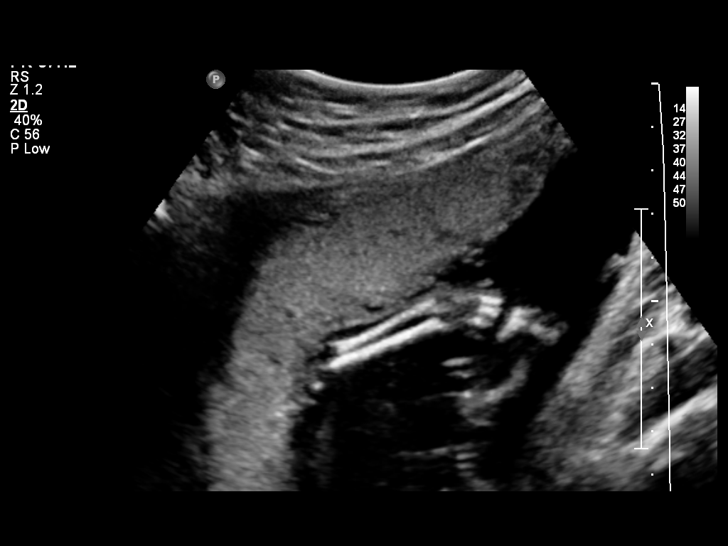
[im 40/63]
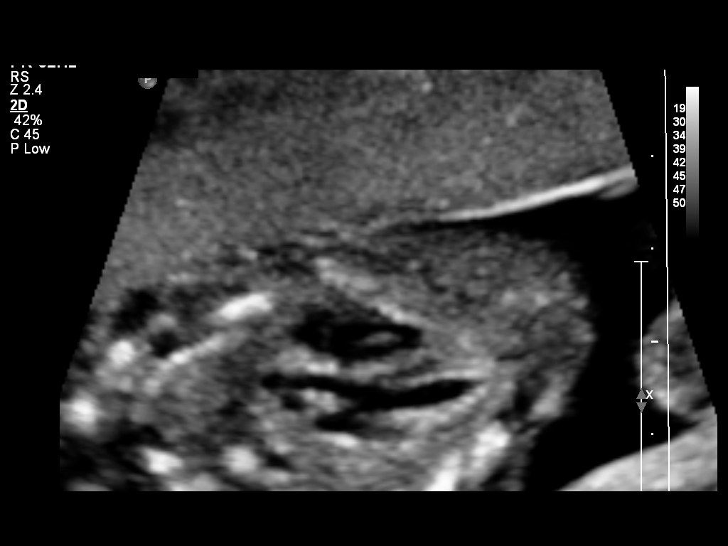
[im 44/63]
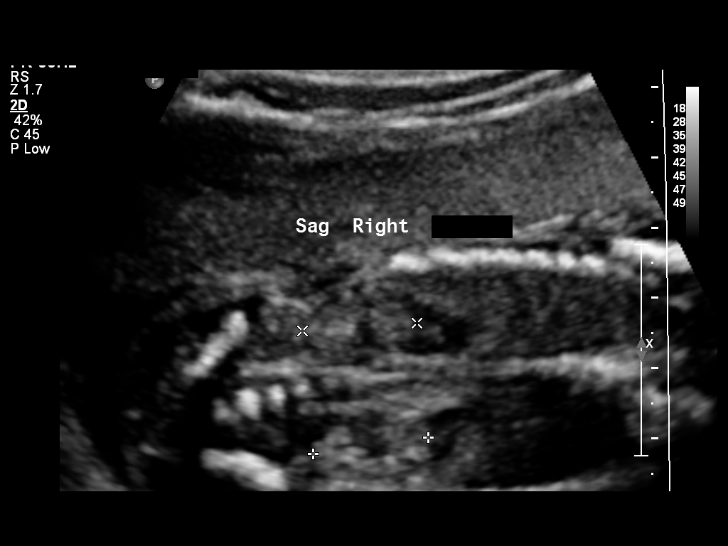
[im 51/63]
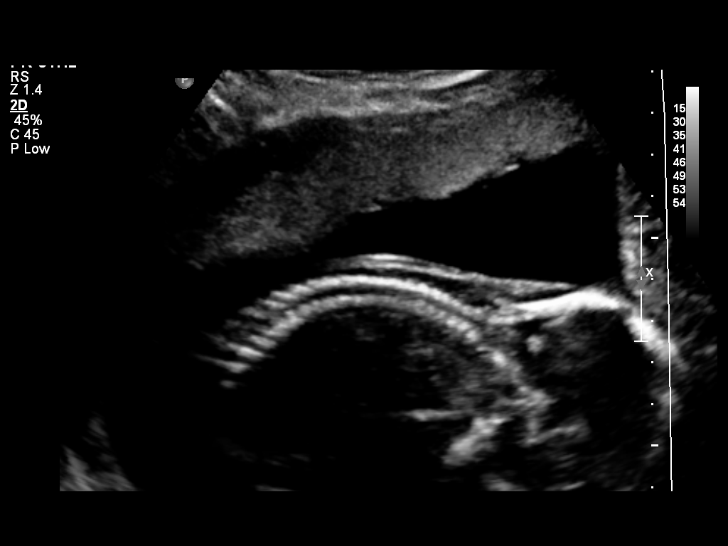
[im 56/63]
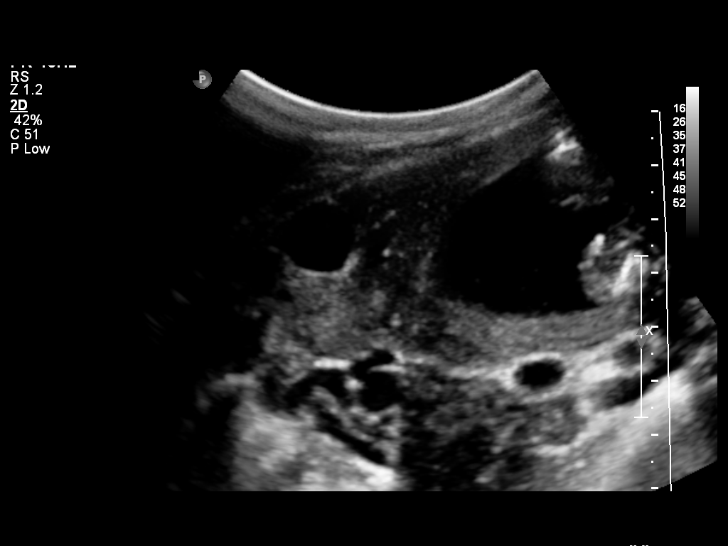
[im 60/63]
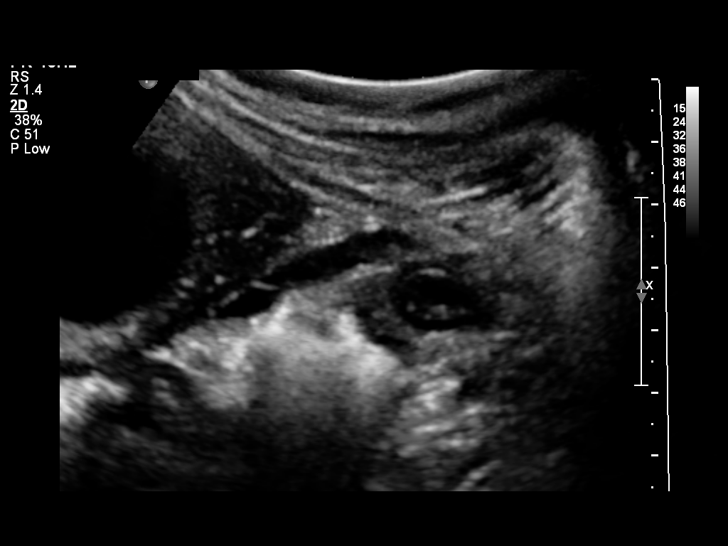

[12 of 28 positions shown; findings below may reference images not displayed]

OBSTETRICS REPORT
                      (Signed Final 08/27/2013 [DATE])

Service(s) Provided

 US OB COMP + 14 WK                                    76805.1
Indications

 Basic anatomic survey
Fetal Evaluation

 Num Of Fetuses:    1
 Fetal Heart Rate:  153                          bpm
 Cardiac Activity:  Observed
 Presentation:      Variable
 Placenta:          Anterior, above cervical os
 P. Cord            Visualized, central
 Insertion:

 Amniotic Fluid
 AFI FV:      Subjectively within normal limits
                                             Larg Pckt:    2.91  cm
 RUQ:   2.91    cm
Biometry

 BPD:     44.8  mm     G. Age:  19w 4d                CI:        70.79   70 - 86
                                                      FL/HC:      18.6   16.1 -

 HC:     169.7  mm     G. Age:  19w 4d       64  %    HC/AC:      1.22   1.09 -

 AC:     138.9  mm     G. Age:  19w 2d       51  %    FL/BPD:
 FL:      31.6  mm     G. Age:  19w 6d       67  %    FL/AC:      22.8   20 - 24
 HUM:       29  mm     G. Age:  19w 3d       58  %
 CER:     18.4  mm     G. Age:  18w 1d       23  %
 NFT:     4.27  mm
 Est. FW:     299  gm    0 lb 11 oz      52  %
Gestational Age

 LMP:           19w 1d        Date:  04/15/13                 EDD:   01/20/14
 U/S Today:     19w 4d                                        EDD:   01/17/14
 Best:          19w 1d     Det. By:  LMP  (04/15/13)          EDD:   01/20/14
Anatomy
 Cranium:          Appears normal         LVOT:             Appears normal
 Fetal Cavum:      Appears normal         Diaphragm:        Appears normal
 Ventricles:       Appears normal         Stomach:          Appears normal
 Choroid Plexus:   Appears normal         Abdomen:          Appears normal
 Cerebellum:       Appears normal         Abdominal Wall:   Appears nml (cord
                                                            insert, abd wall)
 Posterior Fossa:  Appears normal         Cord Vessels:     Appears normal (3
                                                            vessel cord)
 Nuchal Fold:      Appears normal         Kidneys:          Appear normal
 Face:             Appears normal         Bladder:          Appears normal
                   (orbits and profile)
 Lips:             Appears normal         Spine:            Appears normal
 Heart:            Appears normal         Lower             Appears normal
                   (4CH, axis, and        Extremities:
                   situs)
 RVOT:             Appears normal         Upper             Appears normal
                                          Extremities:

 Other:  Fetus appears to be a male. Heels and 5th digit visualized. Nasal
         bone visualized.
Targeted Anatomy

 Fetal Central Nervous System
 Lat. Ventricles:  7.3                    Cisterna Magna:
Cervix Uterus Adnexa

 Cervical Length:    3.81     cm

 Cervix:       Normal appearance by transabdominal scan.
 Uterus:       No abnormality visualized.
 Cul De Sac:   No free fluid seen.
 Left Ovary:    Size(cm) L: 2.61 x W: 2.19 x H: 1.69  Volume(cc):
                Within normal limits.
 Right Ovary:   Size(cm) L: 2.66 x W: 1.96 x H: 1.62  Volume(cc):
                Within normal limits.
 Adnexa:     No abnormality visualized.
Impression

 Active SIUP at 14w1d
 EFW 52nd percentile
 No dysmorphic features
 No previa
Recommendations

 Follow up ultrasounds as clinically indicated.

 questions or concerns.

## 2015-10-21 ENCOUNTER — Encounter (HOSPITAL_COMMUNITY): Payer: Self-pay | Admitting: Emergency Medicine

## 2015-10-21 ENCOUNTER — Emergency Department (HOSPITAL_COMMUNITY)
Admission: EM | Admit: 2015-10-21 | Discharge: 2015-10-21 | Disposition: A | Discharge status: Home / Self Care | Payer: Medicaid Other | Attending: Emergency Medicine | Admitting: Emergency Medicine

## 2015-10-21 DIAGNOSIS — N926 Irregular menstruation, unspecified: Secondary | ICD-10-CM

## 2015-10-21 DIAGNOSIS — N912 Amenorrhea, unspecified: Secondary | ICD-10-CM | POA: Insufficient documentation

## 2015-10-21 DIAGNOSIS — Z79899 Other long term (current) drug therapy: Secondary | ICD-10-CM | POA: Insufficient documentation

## 2015-10-21 DIAGNOSIS — Z3202 Encounter for pregnancy test, result negative: Secondary | ICD-10-CM | POA: Insufficient documentation

## 2015-10-21 LAB — I-STAT BETA HCG BLOOD, ED (MC, WL, AP ONLY): I-stat hCG, quantitative: <5 m[IU]/mL (ref <5)

## 2015-10-21 NOTE — Discharge Instructions (Signed)
You have been seen today for a missed period and a pregnancy test. [facility] with [facility] to establish care with an OB/GYN and for chronic management of this issue. Follow up with PCP as needed. Return to ED should symptoms worsen.   Emergency Department Resource Guide 1) Find a Doctor and Pay Out of Pocket Although you won't have to find out who is covered by your insurance plan, it is a good idea to ask around and get recommendations. You will then need to call the office and see if the doctor you have chosen will accept you as a new patient and what types of options they offer for patients who are self-pay. Some doctors offer discounts or will set up payment plans for their patients who do not have insurance, but you will need to ask so you aren't surprised when you get to your appointment.  2) Contact Your Local Health Department Not all health departments have doctors that can see patients for sick visits, but many do, so it is worth a call to see if yours does. If you don't know where your local health department is, you can check in your phone book. The CDC also has a tool to help you locate your state's health department, and many state websites also have listings of all of their local health departments.  3) Find a Walk-in Clinic If your illness is not likely to be very severe or complicated, you may want to try a walk in clinic. These are popping up all over the country in pharmacies, drugstores, and shopping centers. They're usually staffed by nurse practitioners or physician assistants that have been trained to treat common illnesses and complaints. They're usually fairly quick and inexpensive. However, if you have serious medical issues or chronic medical problems, these are probably not your best option.  No Primary Care Doctor: - Call Health Connect at  [phone] - they can help you locate a primary care doctor that  accepts your insurance, provides certain services,  etc. - Physician Referral Service- [phone]  Chronic Pain Problems: Organization         Address  Phone   Notes  [facility]  [phone] Patients need to be referred by their primary care doctor.   Medication Assistance: Organization         Address  Phone   Notes  [facility] Medication [facility] [address] [place], [place] [address] [phone] --Must be a resident of [facility] -- Must have NO insurance coverage whatsoever (no Medicaid/ Medicare, etc.) -- The pt. MUST have a primary care doctor that directs their care regularly and follows them in the community   [facility]  [phone]   [employer]  [phone]    Agencies that provide inexpensive medical care: Organization         Address  Phone   Notes  [facility]  [phone]   [facility]    [phone]   [facility] [address] [place], [place] [address] [phone]   [facility] [address] [place] [place] [phone]   Planned Parenthood    [phone]   [facility]    [phone]   [facility] and [facility]  [address], [place] Phone:  [phone], Fax:  [phone] Hours of Operation:  9 am - 6 pm, M-F.  Also  accepts Medicaid/Medicare and self-pay.  [facility] for [place], [address], [place] Phone: [phone], Fax: [phone]. Hours of Operation:  8:30 am - 5:30 pm, M-F.  Also accepts Medicaid and self-pay.  [facility] [place] [address] [place] Phone: [phone]   [place], [place], [place] [phone], Ext. [phone] Mondays & Thursdays: 7-9 AM.  First 15 patients are seen on a first come, first serve basis.    [place] Providers:  Organization         Address  Phone   Notes  [facility] [address], [place] [phone] Also accepts self-pay patients.  [facility] [address] [place], [place]  [phone]   [place], [address], [place] [phone]   [facility] [address] [place] [phone]   Lucianne Lei [address], [place]   [phone] Only accepts [place] Access [place] patients after they have their name applied to their card.   Self-Pay (no insurance) in [facility]:  Organization         Address  Phone   Notes  Sickle Cell Patients, [facility] [place] [phone]   [facility] [place] [phone]   [facility] [place]  [place], [place], [place] [phone]   [facility]/Dr. Osei-Bonsu  [address], [place] or [address], [place] [phone] Phone number for both [place] and [place] locations is the same.  Urgent Medical and [facility] [address], [place] [phone]   [facility] [address] [place] or [address] [phone]   [facility] [address], [place] [phone], phone; [phone], fax Sees patients 1st and 3rd Saturday of every month.  Must not qualify for public or private insurance (i.e. Medicaid, Medicare, [place] Health Choice, Veterans' Benefits)  Household income should be no more than 200% of the poverty level The clinic cannot treat you if you are pregnant or think you are pregnant  Sexually transmitted diseases are not treated at the clinic.    Dental Care: Organization         Address  Phone  Notes  [facility] Department of [facility] [place] [phone] Accepts children up to  age 73 who are enrolled in [place] or [place]; pregnant women with a Medicaid card; and children who have applied for Medicaid or [place] Health Choice, but were declined, whose parents can pay a reduced fee at time of service.  [facility] Department of [facility]  [address], [place] [phone] Accepts children up to age 74 who are enrolled in [place] or [place]; pregnant women with a Medicaid card; and children who have applied for Medicaid or  Health Choice, but were declined, whose parents can pay a reduced fee at time of service.  [facility] Access PROGRAM  [place] [phone] Patients are seen by appointment only. Walk-ins are not accepted. [place] will see patients 74 years of age and older. Monday - Tuesday (8am-5pm)  Most Wednesdays (8:30-5pm) $30 per visit, cash only  [facility] Adult [employer] PROGRAM  [address], [facility] [phone] Patients are seen by appointment only. Walk-ins are not accepted. [place] will see patients 23 years of age and older. One Wednesday Evening (Monthly: Volunteer Based).  $30 per visit, cash only  [place]  [phone] for adults; Children under age 57, call Graduate Pediatric Dentistry at [phone]. Children aged 71-14, please call [phone] to request a pediatric application.  Dental services are provided in all areas of dental care including fillings, crowns and bridges, complete and partial dentures, implants, gum treatment, root canals, and extractions. Preventive care is also provided. Treatment is provided to both adults and children. Patients are selected via a lottery and there is often a waiting list.   [facility] [address] [place]  [phone] [email or web address]   [facility] [address] [place], [place] [phone], Ext. [phone] Second and Fourth Thursday of  each month, opens at 6:30 AM; Clinic ends at 9 AM.  Patients are seen on a first-come first-served basis, and a limited number are seen during each clinic.   [facility]  [address] Hillard Danker [place], [place] [phone]   Eligibility Requirements You must have lived in [place], [place], or [place] counties for at least the last three months.   You cannot be eligible for state or federal sponsored [employer], including [employer], [place], or [employer].   You generally cannot be eligible for healthcare insurance through your employer.    How to apply: Eligibility screenings are held every Tuesday and Wednesday afternoon from 1:00 pm until 4:00 pm. You do not need an appointment for the interview!  [facility] [address] [place], [place]   [place]  [place] Department  [place]  [phone]    Behavioral Health Resources in the Community: Intensive Outpatient Programs Organization         Address  Phone  Notes  [facility] [place]. [address], [place], [place] [phone]   [facility] [address], [place], [place]   ADS: Alcohol & Drug Svcs [address] [place], [place]   [place] [address],  [place], [place] or [phone]   Substance Abuse Resources Organization         Address  Phone  Notes  Alcohol and Drug Services  [phone]   [place]  [phone]   The [place]   Chinita Pester  [phone]   Residential & Outpatient Substance Abuse Program  [phone]   Psychological Services Organization         Address  Phone  Notes  [facility]  [place]  [phone]   [place] [address],  [place] or [phone]    Mobile Crisis Teams Organization         Address  Phone  Notes  [facility], Mobile Crisis Care Unit  [phone]   [facility]  [address] [place], [place]   [place] [address] [place] [phone]    Self-Help/Support Groups Organization         Address  Phone  Notes  [facility] [place] - variety of support groups  [place] Call for more information  Narcotics Anonymous (NA), [facility] [address], [employer] [place]  2 meetings at this location   Special educational needs teacher         Address  Phone  Notes  ASAP Residential Treatment [place],    [place]  [phone]   [facility]  [address], [place] [phone], [place], [place]   [place], [place] [phone] Admissions: 8am-3pm M-F  Incentives Substance [place] [address],    [place], [place] [phone]   [facility] [address] [place], [place], [place]   [facility] [address]  [place], [place]   [facility] - Intensive Outpatient [address], [place], [place]   [facility] ([place].,  [place], [place] [phone] or [phone]   [facility] Treatment Services (RTS) [address] [place], [place] Accepts Medicaid  [facility] [place] [address],  [place] [phone] Substance Abuse/Addiction Treatment   [facility] Organization         Address  Phone  Notes  [facility]  [phone]   Domenic Schwab, PhD [address] [place]lis [place], [place]   [phone] or [phone]   [place], [place] [phone]     [facility] [address], [place], [place] [phone] Insurance/Medicaid/sponsorship through [facility] and Families [address], [address]                                    [place], [place] [phone] [place] [address], [place] [phone]    Dr. Adele Schilder  [phone]   [facility] Dept. 1) [address], [place] 2) [place] 3)  [place], [place] [phone]  [phone]   [place] [phone] or [phone] (After Hours)

## 2015-10-21 NOTE — ED Notes (Addendum)
LMP was 09/16/15. [facility] which was neg. Here to "find out what's going on". Pt does not use birth control. Does not have GYN care.

## 2015-10-21 NOTE — ED Provider Notes (Signed)
CSN: [id number]     Arrival date & time 10/21/15  1336 History   By signing my name below, I, Evon Slack, attest that this documentation has been prepared under the direction and in the presence of Shawn Joy, PA-C. Electronically Signed: Evon Slack, ED Scribe. 10/21/2015. 3:04 PM.     Chief Complaint  Patient presents with  . Possible Pregnancy   Patient is a 25 y.o. female presenting with pregnancy problem. The history is provided by the patient. No language interpreter was used.  Possible Pregnancy Pertinent negatives include no abdominal pain.   HPI Comments: Margaret [name] is a 25 y.o. female who presents to the Emergency Department for pregnancy test. Pt states that she has not had her menstrual cycle since September 15, 2014. Pt doesn't report any other symptoms at this time. LMP 09/16/2015. #[id number]   History reviewed. No pertinent past medical history. History reviewed. No pertinent past surgical history. Family History  Problem Relation Age of Onset  . Hypertension Mother   . Diabetes Father   . Stroke Maternal Grandmother   . Cancer Maternal Grandfather   . Cancer Paternal Grandmother    Social History  Substance Use Topics  . Smoking status: Never Smoker   . Smokeless tobacco: None  . Alcohol Use: No   OB History    Gravida Para Term Preterm AB TAB SAB Ectopic Multiple Living   Review of Systems  Constitutional: Negative for fever.  Gastrointestinal: Negative for nausea, vomiting and abdominal pain.  Genitourinary: Positive for menstrual problem. Negative for vaginal bleeding, vaginal discharge and pelvic pain.  Skin: Negative for color change and pallor.     Allergies  Review of patient's allergies indicates no known allergies.  Home Medications   Prior to Admission medications   Medication Sig Start Date End Date Taking? Authorizing Provider  Prenatal Vit-Fe Fumarate-FA (PRENATAL MULTIVITAMIN) TABS tablet Take 1 tablet by mouth  daily at 12 noon.    Historical Provider, MD   BP 112/79 mmHg  Pulse 73  Temp(Src) 97.9 F (36.6 C) (Oral)  Resp 18  SpO2 99%   Physical Exam  Constitutional: She appears well-developed and well-nourished. No distress.  HENT:  Head: Normocephalic and atraumatic.  Eyes: Conjunctivae are normal.  Neck: Neck supple.  Cardiovascular: Normal rate.   Pulmonary/Chest: Effort normal. No respiratory distress.  Abdominal: Soft. There is no tenderness.  Musculoskeletal: Normal range of motion.  Neurological: She is alert.  Skin: Skin is warm and dry.  Psychiatric: She has a normal mood and affect. Her behavior is normal.  Nursing note and vitals reviewed.   ED Course  Procedures (including critical care time) DIAGNOSTIC STUDIES: Oxygen Saturation is 99% on RA, normal by my interpretation.    COORDINATION OF CARE: 3:04 PM-Discussed treatment plan with pt at bedside and pt agreed to plan.     Labs Review Labs Reviewed  I-STAT BETA HCG BLOOD, ED ([facility], [facility], [facility] ONLY)    Imaging Review No results found. I have personally reviewed and evaluated these lab results as part of my medical decision-making.   EKG Interpretation None      MDM   Final diagnoses:  Missed period    Margaret [name] presents for a pregnancy test due to a missed period.  Patient has no complaints at this time. States that she just wants a pregnancy test. Pregnancy test is negative. Since patient has no complaints, pain,  or any other concerns she will be discharged [facility].    I personally performed the services described in this documentation, which was scribed in my presence. The recorded information has been reviewed and is accurate.      Anselm Pancoast, PA-C 10/21/15 1537  Lavera Guise, MD 10/21/15 [phone]

## 2015-10-21 NOTE — ED Notes (Signed)
Pt reports that she is here for a pregnancy test due to late period.

## 2015-11-23 ENCOUNTER — Ambulatory Visit: Payer: Medicaid Other | Admitting: Obstetrics & Gynecology

## 2017-07-31 ENCOUNTER — Other Ambulatory Visit: Payer: Self-pay | Admitting: Obstetrics and Gynecology

## 2017-08-21 ENCOUNTER — Inpatient Hospital Stay (HOSPITAL_COMMUNITY)
Admission: EM | Admit: 2017-08-21 | Discharge: 2017-08-24 | DRG: 788 | Disposition: A | Discharge status: Home / Self Care | Payer: Medicaid Other | Attending: Obstetrics & Gynecology | Admitting: Obstetrics & Gynecology

## 2017-08-21 ENCOUNTER — Other Ambulatory Visit: Payer: Self-pay

## 2017-08-21 ENCOUNTER — Inpatient Hospital Stay (HOSPITAL_COMMUNITY): Payer: Medicaid Other

## 2017-08-21 ENCOUNTER — Encounter (HOSPITAL_COMMUNITY): Payer: Self-pay | Admitting: *Deleted

## 2017-08-21 DIAGNOSIS — Z3A35 35 weeks gestation of pregnancy: Secondary | ICD-10-CM | POA: Diagnosis not present

## 2017-08-21 DIAGNOSIS — O9902 Anemia complicating childbirth: Secondary | ICD-10-CM | POA: Diagnosis present

## 2017-08-21 DIAGNOSIS — O99214 Obesity complicating childbirth: Secondary | ICD-10-CM | POA: Diagnosis present

## 2017-08-21 DIAGNOSIS — O42913 Preterm premature rupture of membranes, unspecified as to length of time between rupture and onset of labor, third trimester: Secondary | ICD-10-CM | POA: Diagnosis present

## 2017-08-21 DIAGNOSIS — O4433 Partial placenta previa with hemorrhage, third trimester: Principal | ICD-10-CM | POA: Diagnosis present

## 2017-08-21 DIAGNOSIS — E669 Obesity, unspecified: Secondary | ICD-10-CM | POA: Diagnosis present

## 2017-08-21 DIAGNOSIS — D649 Anemia, unspecified: Secondary | ICD-10-CM | POA: Diagnosis present

## 2017-08-21 DIAGNOSIS — O4400 Placenta previa specified as without hemorrhage, unspecified trimester: Secondary | ICD-10-CM

## 2017-08-21 DIAGNOSIS — O4690 Antepartum hemorrhage, unspecified, unspecified trimester: Secondary | ICD-10-CM

## 2017-08-21 DIAGNOSIS — O4403 Placenta previa specified as without hemorrhage, third trimester: Secondary | ICD-10-CM | POA: Diagnosis present

## 2017-08-21 LAB — CBC WITH DIFFERENTIAL/PLATELET
Basophils Absolute: 0 10*3/uL (ref 0.0–0.1)
Basophils Relative: 0 %
Eosinophils Absolute: 0.1 10*3/uL (ref 0.0–0.7)
Eosinophils Relative: 1 %
HCT: 31.5 % — ABNORMAL LOW (ref 36.0–46.0)
Hemoglobin: 10.3 g/dL — ABNORMAL LOW (ref 12.0–15.0)
LYMPHS ABS: 1.6 10*3/uL (ref 0.7–4.0)
LYMPHS PCT: 20 %
MCH: 26.8 pg (ref 26.0–34.0)
MCHC: 32.7 g/dL (ref 30.0–36.0)
MCV: 82 fL (ref 78.0–100.0)
MONOS PCT: 6 %
Monocytes Absolute: 0.4 10*3/uL (ref 0.1–1.0)
Neutro Abs: 6 10*3/uL (ref 1.7–7.7)
Neutrophils Relative %: 73 %
Platelets: 217 10*3/uL (ref 150–400)
RBC: 3.84 MIL/uL — AB (ref 3.87–5.11)
RDW: 13.7 % (ref 11.5–15.5)
WBC: 8.1 10*3/uL (ref 4.0–10.5)

## 2017-08-21 LAB — BASIC METABOLIC PANEL
Anion gap: 8 (ref 5–15)
CHLORIDE: 110 mmol/L (ref 101–111)
CO2: 19 mmol/L — ABNORMAL LOW (ref 22–32)
CREATININE: 0.54 mg/dL (ref 0.44–1.00)
Calcium: 8.2 mg/dL — ABNORMAL LOW (ref 8.9–10.3)
GFR calc Af Amer: >60 mL/min (ref >60)
GLUCOSE: 72 mg/dL (ref 65–99)
POTASSIUM: 3.6 mmol/L (ref 3.5–5.1)
Sodium: 137 mmol/L (ref 135–145)

## 2017-08-21 LAB — COMPREHENSIVE METABOLIC PANEL
ALT: 9 U/L — ABNORMAL LOW (ref 14–54)
ANION GAP: 11 (ref 5–15)
AST: 12 U/L — AB (ref 15–41)
Albumin: 2.7 g/dL — ABNORMAL LOW (ref 3.5–5.0)
Alkaline Phosphatase: 149 U/L — ABNORMAL HIGH (ref 38–126)
BUN: 5 mg/dL — ABNORMAL LOW (ref 6–20)
CHLORIDE: 108 mmol/L (ref 101–111)
CO2: 18 mmol/L — AB (ref 22–32)
Calcium: 8 mg/dL — ABNORMAL LOW (ref 8.9–10.3)
Creatinine, Ser: 0.51 mg/dL (ref 0.44–1.00)
GFR calc non Af Amer: >60 mL/min (ref >60)
GLUCOSE: 74 mg/dL (ref 65–99)
POTASSIUM: 3.4 mmol/L — AB (ref 3.5–5.1)
Sodium: 137 mmol/L (ref 135–145)
Total Bilirubin: 0.8 mg/dL (ref 0.3–1.2)
Total Protein: 6.3 g/dL — ABNORMAL LOW (ref 6.5–8.1)

## 2017-08-21 LAB — URINALYSIS, ROUTINE W REFLEX MICROSCOPIC
BILIRUBIN URINE: NEGATIVE
Glucose, UA: NEGATIVE mg/dL
Ketones, ur: 5 mg/dL — AB
LEUKOCYTES UA: NEGATIVE
Nitrite: NEGATIVE
PH: 6 (ref 5.0–8.0)
Protein, ur: NEGATIVE mg/dL
SPECIFIC GRAVITY, URINE: 1.005 (ref 1.005–1.030)

## 2017-08-21 LAB — CBC
HEMATOCRIT: 30.4 % — AB (ref 36.0–46.0)
Hemoglobin: 10.1 g/dL — ABNORMAL LOW (ref 12.0–15.0)
MCH: 27.8 pg (ref 26.0–34.0)
MCHC: 33.2 g/dL (ref 30.0–36.0)
MCV: 83.7 fL (ref 78.0–100.0)
PLATELETS: 194 10*3/uL (ref 150–400)
RBC: 3.63 MIL/uL — ABNORMAL LOW (ref 3.87–5.11)
RDW: 13.8 % (ref 11.5–15.5)
WBC: 11.1 10*3/uL — ABNORMAL HIGH (ref 4.0–10.5)

## 2017-08-21 MED ORDER — DOCUSATE SODIUM 100 MG PO CAPS
100.0000 mg | ORAL_CAPSULE | Freq: Every day | ORAL | Status: DC
  Administered 2017-08-22 – 2017-08-24 (×3): 100 mg via ORAL
  Filled 2017-08-21 (×4): qty 1

## 2017-08-21 MED ORDER — ACETAMINOPHEN 325 MG PO TABS
650.0000 mg | ORAL_TABLET | ORAL | Status: DC | PRN
  Administered 2017-08-21 – 2017-08-24 (×2): 650 mg via ORAL
  Filled 2017-08-21 (×2): qty 2

## 2017-08-21 MED ORDER — ZOLPIDEM TARTRATE 5 MG PO TABS: 5.0000 mg | ORAL_TABLET | Freq: Every evening | ORAL | Status: DC | PRN

## 2017-08-21 MED ORDER — CALCIUM CARBONATE ANTACID 500 MG PO CHEW: 2.0000 | CHEWABLE_TABLET | ORAL | Status: DC | PRN

## 2017-08-21 MED ORDER — LACTATED RINGERS IV BOLUS (SEPSIS)
1000.0000 mL | Freq: Once | INTRAVENOUS | Status: AC
  Administered 2017-08-21: 1000 mL via INTRAVENOUS

## 2017-08-21 MED ORDER — BETAMETHASONE SOD PHOS & ACET 6 (3-3) MG/ML IJ SUSP
12.0000 mg | INTRAMUSCULAR | Status: AC
  Administered 2017-08-21 – 2017-08-22 (×2): 12 mg via INTRAMUSCULAR
  Filled 2017-08-21 (×2): qty 2

## 2017-08-21 MED ORDER — DEXTROSE IN LACTATED RINGERS 5 % IV SOLN
INTRAVENOUS | Status: DC
  Administered 2017-08-21 – 2017-08-22 (×3): via INTRAVENOUS

## 2017-08-21 MED ORDER — PRENATAL MULTIVITAMIN CH
1.0000 | ORAL_TABLET | Freq: Every day | ORAL | Status: DC
  Administered 2017-08-23 – 2017-08-24 (×2): 1 via ORAL
  Filled 2017-08-21 (×3): qty 1

## 2017-08-21 MED ORDER — SODIUM CHLORIDE 0.9 % IV BOLUS (SEPSIS)
1000.0000 mL | Freq: Once | INTRAVENOUS | Status: AC
  Administered 2017-08-21: 1000 mL via INTRAVENOUS

## 2017-08-21 NOTE — ED Provider Notes (Signed)
[facility] Provider Note   CSN: [id number] Arrival date & time: 08/21/17  1102     History   Chief Complaint Chief Complaint  Patient presents with  . Vaginal Bleeding    HPI Margaret [name] is a 26 y.o. female G2P1T1, presenting to ED with 1 episode of dark red vaginal bleeding that occurred this morning. States blood was was a dark red clot about pecan-sized. Not actively bleeding, no leakage of fluids or contractions. Feels baby actively moving. Pt is [redacted] weeks pregnant, [facility] 09/25/2016.  Her pregnancy is complicated by placenta previa, which is she is currently being monitored for, with a scheduled C-section on 09/10/2017.  Denies any other complications this pregnancy. Denies urinary sx, abd pain, or other complaints.  The history is provided by the patient.    History reviewed. No pertinent past medical history.  Patient Active Problem List   Diagnosis Date Noted  . Placenta previa antepartum, third trimester 08/21/2017  . Active labor 01/10/2014  . Pregnancy 01/10/2014  . Supervision of low-risk pregnancy 08/25/2013    History reviewed. No pertinent surgical history.  OB History    Gravida Para Term Preterm AB Living   2 1 1  0 0 1   SAB TAB Ectopic Multiple Live Births   0 0 0 0 1       Home Medications    Prior to Admission medications   Medication Sig Start Date End Date Taking? Authorizing Provider  acetaminophen (TYLENOL) 325 MG tablet Take 650 mg by mouth every 6 (six) hours as needed for mild pain or headache.   Yes [provider]  Prenatal Vit-Fe Fumarate-FA (PRENATAL MULTIVITAMIN) TABS tablet Take 1 tablet by mouth daily at 12 noon.   Yes [provider]    Family History Family History  Problem Relation Age of Onset  . Hypertension Mother   . Diabetes Father   . Stroke Maternal Grandmother   . Cancer Maternal Grandfather   . Cancer Paternal Grandmother     Social History Social History   Tobacco Use    . Smoking status: Never Smoker  . Smokeless tobacco: Never Used  Substance Use Topics  . Alcohol use: No  . Drug use: No     Allergies   Patient has no known allergies.   Review of Systems Review of Systems  Gastrointestinal: Negative for abdominal pain.  Genitourinary: Positive for vaginal bleeding. Negative for dysuria and frequency.  All other systems reviewed and are negative.    Physical Exam Updated Vital Signs BP 128/74 (BP Location: Left Arm)   Pulse 74   Temp 98.3 F (36.8 C) (Oral)   Resp 18   Ht 5\' 3"  (1.6 m)   Wt 77.6 kg (171 lb)   LMP  (Approximate)   SpO2 100%   Breastfeeding? No   BMI 30.29 kg/m   Physical Exam  Constitutional: She appears well-developed and well-nourished. No distress.  HENT:  Head: Normocephalic and atraumatic.  Mouth/Throat: Oropharynx is clear and moist.  Eyes: Conjunctivae are normal.  Cardiovascular: Normal rate, regular rhythm, normal heart sounds and intact distal pulses.  Pulmonary/Chest: Effort normal and breath sounds normal. No respiratory distress.  Abdominal: Soft.  Gravid abdomen.  Neurological: She is alert.  Skin: Skin is warm.  Psychiatric: She has a normal mood and affect. Her behavior is normal.  Nursing note and vitals reviewed.    ED Treatments / Results  Labs (all labs ordered are listed, but only abnormal  results are displayed) Labs Reviewed  CBC WITH DIFFERENTIAL/PLATELET - Abnormal; Notable for the following components:      Result Value   RBC 3.84 (*)    Hemoglobin 10.3 (*)    HCT 31.5 (*)    All other components within normal limits  BASIC METABOLIC PANEL - Abnormal; Notable for the following components:   CO2 19 (*)    BUN <5 (*)    Calcium 8.2 (*)    All other components within normal limits  CBC - Abnormal; Notable for the following components:   WBC 11.1 (*)    RBC 3.63 (*)    Hemoglobin 10.1 (*)    HCT 30.4 (*)    All other components within normal limits  COMPREHENSIVE  METABOLIC PANEL - Abnormal; Notable for the following components:   Potassium 3.4 (*)    CO2 18 (*)    BUN 5 (*)    Calcium 8.0 (*)    Total Protein 6.3 (*)    Albumin 2.7 (*)    AST 12 (*)    ALT 9 (*)    Alkaline Phosphatase 149 (*)    All other components within normal limits  URINALYSIS, ROUTINE W REFLEX MICROSCOPIC - Abnormal; Notable for the following components:   Color, Urine STRAW (*)    Hgb urine dipstick LARGE (*)    Ketones, ur 5 (*)    Bacteria, UA RARE (*)    Squamous Epithelial / LPF 0-5 (*)    All other components within normal limits  URINE CULTURE  URINALYSIS, ROUTINE W REFLEX MICROSCOPIC  TYPE AND SCREEN  TYPE AND SCREEN    EKG  EKG Interpretation None       Radiology No results found.  Procedures Procedures (including critical care time)  Medications Ordered in ED Medications  acetaminophen (TYLENOL) tablet 650 mg (not administered)  zolpidem (AMBIEN) tablet 5 mg (not administered)  docusate sodium (COLACE) capsule 100 mg (not administered)  calcium carbonate (TUMS - dosed in mg elemental calcium) chewable tablet 400 mg of elemental calcium (not administered)  prenatal multivitamin tablet 1 tablet (not administered)  dextrose 5 % in lactated ringers infusion (not administered)  betamethasone acetate-betamethasone sodium phosphate (CELESTONE) injection 12 mg (12 mg Intramuscular Given 08/21/17 1220)  sodium chloride 0.9 % bolus 1,000 mL (1,000 mLs Intravenous New Bag/Given 08/21/17 1034)     Initial Impression / Assessment and Plan / ED Course  I have reviewed the triage vital signs and the nursing notes.  Pertinent labs & imaging results that were available during my care of the patient were reviewed by me and considered in my medical decision making (see chart for details).  Clinical Course as of Aug 22 1251  Wed Aug 21, 2017  0945 Rapid OB nurse, Montez MoritaErin Hampton, at bedside upon evaluation. Fetal HR 143. Monitoring for contractions.  [JR]   [id number] Per OB RN, pt contracting. She [facility] with [facility] Normand Sloopillard[facility] Dr. Jaymes GraffNaima Dillard accepting [facility].  [JR]    Clinical Course User Index [JR] Robinson, [place] N, PA-C    Pt [redacted] wks gestation w known placenta previa, presenting with dark red vaginal bleeding, described as a clot, this morning.  Not actively bleeding, denies leakage of fluids.  Patient feels baby moving. VSS stable.  Rapid OB nurse monitoring revealing patient is contracting. [facility]. Stable and safe for transfer at this time.  Patient discussed with  Dr. Erma HeritageIsaacs. Final Clinical Impressions(s) / ED Diagnoses   Final diagnoses:  Vaginal bleeding in pregnancy    ED Discharge Orders    None       Robinson, [place] N, PA-C 08/21/17 1252    Shaune PollackIsaacs, Cameron, MD 08/21/17 2052

## 2017-08-21 NOTE — H&P (Signed)
Margaret Margaret [name] is Margaret [name] 26 y.o. female presenting for bleeding and knownprevia.   OB History    Gravida Para Term Preterm AB Living   2 1 1  0 0 1   SAB TAB Ectopic Multiple Live Births   0 0 0 0 1     History reviewed. No pertinent past medical history. History reviewed. No pertinent surgical history. Family History: family history includes Cancer in her maternal grandfather and paternal grandmother; Diabetes in her father; Hypertension in her mother; Stroke in her maternal grandmother. Social History:  reports that  has never smoked. she has never used smokeless tobacco. She reports that she does not drink alcohol or use drugs.     Maternal Diabetes: No Genetic Screening: Normal Maternal Ultrasounds/Referrals: Abnormal:  Findings:   Other: Fetal Ultrasounds or other Referrals:  None Maternal Substance Abuse:  No Significant Maternal Medications:  None Significant Maternal Lab Results:  None Other Comments:  None  ROS History   Blood pressure 119/79, pulse 95, temperature 98.5 F (36.9 C), temperature source Oral, resp. rate 18, height 5\' 3"  (1.6 m), weight 77.6 kg (171 lb), SpO2 100 %, not currently breastfeeding. Exam Physical Examination: General appearance - alert, well appearing, and in no distress Chest - clear to auscultation, no wheezes, rales or rhonchi, symmetric air entry Heart - normal rate and regular rhythm Abdomen - soft, nontender, nondistended, no masses or organomegaly Pelvic - normal external genitalia, vulva, vagina, cervix, uterus and adnexa, old clot seen in the cervix Extremities - Homan's sign negative bilaterally Prenatal labs: ABO, Rh: --/--/Margaret [name] POS (12/12 1205) Antibody: NEG (12/12 1205) Rubella:   RPR:    HBsAg:    HIV:    GBS:     Assessment/Plan: Placenta previa First bleed Pt stable and bleeding has stopped BMZ MFM consult for US and would probably plan delivery after steroids .  Will await their input Cat 1 with contractions will monitor  closely Type and cross two units   Margaret Margaret [name] 08/21/2017, 3:16 PM

## 2017-08-21 NOTE — Progress Notes (Signed)
This note also relates to the following rows which could not be included: Pulse Rate - Cannot attach notes to unvalidated device data Resp - Cannot attach notes to unvalidated device data SpO2 - Cannot attach notes to unvalidated device data  EFM off for transport via [facility].

## 2017-08-21 NOTE — ED Triage Notes (Signed)
Pt in c/o Vaginal bleeding once today with clots when urinating, denies current bleeding, pt reports being monitored for Placenta Previa, pt [redacted] wks pregnant, has scheduled c section 09/10/17, denies pain, reports feeling the baby move

## 2017-08-21 NOTE — ED Notes (Signed)
RR ob gyn RN paged

## 2017-08-21 NOTE — Progress Notes (Signed)
0930 Arrived to evaluate this 26 yo G2P1 @ [redacted] wks GA in with report of vaginal bleeding.  Hx of placenta previa with this pregnancy. For scheduled C/S 09/10/17.  This is her 1st bleed.  No other concerns with this pregnancy. FHR reassuring, non painful UC's Q 1.5 min. [id number] Dr. Normand Sloopillard notified of pt in ED and of above. Requests transfer of pt to MAU. Orders to keep pt NPO. 1013 Report to Agustin CreeNicole Druebbisch, RN MAU.  EDP notified and [facility] transfer will be requested. IVF bolus requested.

## 2017-08-21 NOTE — ED Notes (Signed)
[facility] notified of transfer to [facility] @ [facility].

## 2017-08-22 ENCOUNTER — Encounter (HOSPITAL_COMMUNITY): Payer: Self-pay | Admitting: Neonatology

## 2017-08-22 ENCOUNTER — Inpatient Hospital Stay (HOSPITAL_COMMUNITY): Payer: Medicaid Other

## 2017-08-22 ENCOUNTER — Encounter (HOSPITAL_COMMUNITY)
Admission: EM | Disposition: A | Discharge status: Home / Self Care | Payer: Self-pay | Source: Home / Self Care | Attending: Obstetrics & Gynecology

## 2017-08-22 LAB — CBC WITH DIFFERENTIAL/PLATELET
Basophils Absolute: 0 10*3/uL (ref 0.0–0.1)
Basophils Relative: 0 %
Eosinophils Absolute: 0 10*3/uL (ref 0.0–0.7)
Eosinophils Relative: 0 %
HEMATOCRIT: 29.9 % — AB (ref 36.0–46.0)
HEMOGLOBIN: 9.8 g/dL — AB (ref 12.0–15.0)
LYMPHS ABS: 1.8 10*3/uL (ref 0.7–4.0)
LYMPHS PCT: 13 %
MCH: 27.5 pg (ref 26.0–34.0)
MCHC: 32.8 g/dL (ref 30.0–36.0)
MCV: 83.8 fL (ref 78.0–100.0)
MONOS PCT: 3 %
Monocytes Absolute: 0.4 10*3/uL (ref 0.1–1.0)
NEUTROS ABS: 12.1 10*3/uL — AB (ref 1.7–7.7)
NEUTROS PCT: 84 %
Platelets: 209 10*3/uL (ref 150–400)
RBC: 3.57 MIL/uL — ABNORMAL LOW (ref 3.87–5.11)
RDW: 13.9 % (ref 11.5–15.5)
WBC: 14.2 10*3/uL — ABNORMAL HIGH (ref 4.0–10.5)

## 2017-08-22 LAB — URINE CULTURE: Culture: <10000 — AB

## 2017-08-22 LAB — PREPARE RBC (CROSSMATCH)

## 2017-08-22 LAB — ABO/RH: ABO/RH(D): A POS

## 2017-08-22 LAB — AMNISURE RUPTURE OF MEMBRANE (ROM) NOT AT ARMC: Amnisure ROM: POSITIVE

## 2017-08-22 SURGERY — Surgical Case
Anesthesia: Spinal | Site: Abdomen | Wound class: Clean Contaminated

## 2017-08-22 MED ORDER — FENTANYL CITRATE (PF) 100 MCG/2ML IJ SOLN
INTRAMUSCULAR | Status: DC | PRN
  Administered 2017-08-22: 10 ug via INTRATHECAL

## 2017-08-22 MED ORDER — FENTANYL CITRATE (PF) 100 MCG/2ML IJ SOLN: 25.0000 ug | INTRAMUSCULAR | Status: DC | PRN

## 2017-08-22 MED ORDER — PHENYLEPHRINE HCL 10 MG/ML IJ SOLN
INTRAMUSCULAR | Status: DC | PRN
  Administered 2017-08-22: 80 ug via INTRAVENOUS

## 2017-08-22 MED ORDER — DIPHENHYDRAMINE HCL 25 MG PO CAPS
25.0000 mg | ORAL_CAPSULE | Freq: Four times a day (QID) | ORAL | Status: DC | PRN
  Administered 2017-08-22: 25 mg via ORAL
  Filled 2017-08-22: qty 1

## 2017-08-22 MED ORDER — CEFAZOLIN SODIUM-DEXTROSE 2-3 GM-%(50ML) IV SOLR
INTRAVENOUS | Status: AC
  Filled 2017-08-22: qty 50

## 2017-08-22 MED ORDER — OXYTOCIN 40 UNITS IN LACTATED RINGERS INFUSION - SIMPLE MED: 2.5000 [IU]/h | INTRAVENOUS | Status: AC

## 2017-08-22 MED ORDER — ONDANSETRON HCL 4 MG/2ML IJ SOLN
INTRAMUSCULAR | Status: AC
  Filled 2017-08-22: qty 2

## 2017-08-22 MED ORDER — DIPHENHYDRAMINE HCL 25 MG PO CAPS: 25.0000 mg | ORAL_CAPSULE | ORAL | Status: DC | PRN

## 2017-08-22 MED ORDER — NALOXONE HCL 0.4 MG/ML IJ SOLN: 0.4000 mg | INTRAMUSCULAR | Status: DC | PRN

## 2017-08-22 MED ORDER — LACTATED RINGERS IV SOLN
INTRAVENOUS | Status: DC | PRN
  Administered 2017-08-22: 40 [IU] via INTRAVENOUS

## 2017-08-22 MED ORDER — MENTHOL 3 MG MT LOZG: 1.0000 | LOZENGE | OROMUCOSAL | Status: DC | PRN

## 2017-08-22 MED ORDER — EPHEDRINE 5 MG/ML INJ
INTRAVENOUS | Status: AC
  Filled 2017-08-22: qty 10

## 2017-08-22 MED ORDER — NALBUPHINE HCL 10 MG/ML IJ SOLN: 5.0000 mg | Freq: Once | INTRAMUSCULAR | Status: DC | PRN

## 2017-08-22 MED ORDER — ACETAMINOPHEN 500 MG PO TABS
ORAL_TABLET | ORAL | Status: AC
  Filled 2017-08-22: qty 2

## 2017-08-22 MED ORDER — SENNOSIDES-DOCUSATE SODIUM 8.6-50 MG PO TABS
2.0000 | ORAL_TABLET | ORAL | Status: DC
  Administered 2017-08-22 – 2017-08-23 (×2): 2 via ORAL
  Filled 2017-08-22 (×2): qty 2

## 2017-08-22 MED ORDER — MEPERIDINE HCL 25 MG/ML IJ SOLN: 6.2500 mg | INTRAMUSCULAR | Status: DC | PRN

## 2017-08-22 MED ORDER — NALBUPHINE HCL 10 MG/ML IJ SOLN: 5.0000 mg | INTRAMUSCULAR | Status: DC | PRN

## 2017-08-22 MED ORDER — CEFAZOLIN SODIUM-DEXTROSE 2-4 GM/100ML-% IV SOLN
2.0000 g | INTRAVENOUS | Status: AC
  Administered 2017-08-22: 2 g via INTRAVENOUS
  Filled 2017-08-22: qty 100

## 2017-08-22 MED ORDER — SODIUM CHLORIDE 0.9 % IR SOLN
Status: DC | PRN
  Administered 2017-08-22: 1

## 2017-08-22 MED ORDER — SIMETHICONE 80 MG PO CHEW
80.0000 mg | CHEWABLE_TABLET | ORAL | Status: DC
  Administered 2017-08-22 – 2017-08-23 (×2): 80 mg via ORAL
  Filled 2017-08-22 (×2): qty 1

## 2017-08-22 MED ORDER — IBUPROFEN 600 MG PO TABS
600.0000 mg | ORAL_TABLET | Freq: Four times a day (QID) | ORAL | Status: DC
  Administered 2017-08-22 – 2017-08-24 (×9): 600 mg via ORAL
  Filled 2017-08-22 (×9): qty 1

## 2017-08-22 MED ORDER — SOD CITRATE-CITRIC ACID 500-334 MG/5ML PO SOLN: 30.0000 mL | ORAL | Status: DC

## 2017-08-22 MED ORDER — SODIUM CHLORIDE 0.9 % IV SOLN: Freq: Once | INTRAVENOUS | Status: DC

## 2017-08-22 MED ORDER — LACTATED RINGERS IV SOLN
INTRAVENOUS | Status: DC
  Administered 2017-08-22 – 2017-08-23 (×2): via INTRAVENOUS

## 2017-08-22 MED ORDER — SOD CITRATE-CITRIC ACID 500-334 MG/5ML PO SOLN
ORAL | Status: AC
  Administered 2017-08-22: 30 mL
  Filled 2017-08-22: qty 15

## 2017-08-22 MED ORDER — WITCH HAZEL-GLYCERIN EX PADS: 1.0000 "application " | MEDICATED_PAD | CUTANEOUS | Status: DC | PRN

## 2017-08-22 MED ORDER — LACTATED RINGERS IV SOLN
INTRAVENOUS | Status: DC | PRN
  Administered 2017-08-22 (×2): via INTRAVENOUS

## 2017-08-22 MED ORDER — PROMETHAZINE HCL 25 MG/ML IJ SOLN: 6.2500 mg | INTRAMUSCULAR | Status: DC | PRN

## 2017-08-22 MED ORDER — SIMETHICONE 80 MG PO CHEW
80.0000 mg | CHEWABLE_TABLET | Freq: Three times a day (TID) | ORAL | Status: DC
  Administered 2017-08-23 – 2017-08-24 (×5): 80 mg via ORAL
  Filled 2017-08-22 (×5): qty 1

## 2017-08-22 MED ORDER — SUCCINYLCHOLINE CHLORIDE 200 MG/10ML IV SOSY
PREFILLED_SYRINGE | INTRAVENOUS | Status: AC
Start: 2017-08-22 — End: ?
  Filled 2017-08-22: qty 10

## 2017-08-22 MED ORDER — ONDANSETRON HCL 4 MG/2ML IJ SOLN
INTRAMUSCULAR | Status: DC | PRN
Start: 2017-08-22 — End: 2017-08-22
  Administered 2017-08-22: 4 mg via INTRAVENOUS

## 2017-08-22 MED ORDER — COCONUT OIL OIL
1.0000 "application " | TOPICAL_OIL | Status: DC | PRN
  Administered 2017-08-23: 1 via TOPICAL
  Filled 2017-08-22: qty 120

## 2017-08-22 MED ORDER — PHENYLEPHRINE 8 MG IN D5W 100 ML (0.08MG/ML) PREMIX OPTIME
INJECTION | INTRAVENOUS | Status: DC | PRN
  Administered 2017-08-22: 80 ug/min via INTRAVENOUS
  Administered 2017-08-22: 60 ug/min via INTRAVENOUS

## 2017-08-22 MED ORDER — MORPHINE SULFATE (PF) 0.5 MG/ML IJ SOLN
INTRAMUSCULAR | Status: DC | PRN
  Administered 2017-08-22: .2 mg via INTRATHECAL

## 2017-08-22 MED ORDER — ACETAMINOPHEN 500 MG PO TABS
1000.0000 mg | ORAL_TABLET | Freq: Once | ORAL | Status: AC
  Administered 2017-08-22: 1000 mg via ORAL

## 2017-08-22 MED ORDER — SIMETHICONE 80 MG PO CHEW: 80.0000 mg | CHEWABLE_TABLET | ORAL | Status: DC | PRN

## 2017-08-22 MED ORDER — BUPIVACAINE IN DEXTROSE 0.75-8.25 % IT SOLN
INTRATHECAL | Status: DC | PRN
  Administered 2017-08-22: 1.6 mL via INTRATHECAL

## 2017-08-22 MED ORDER — ACETAMINOPHEN 500 MG PO TABS
1000.0000 mg | ORAL_TABLET | Freq: Four times a day (QID) | ORAL | Status: AC
  Administered 2017-08-22 – 2017-08-23 (×4): 1000 mg via ORAL
  Filled 2017-08-22 (×4): qty 2

## 2017-08-22 MED ORDER — SCOPOLAMINE 1 MG/3DAYS TD PT72: 1.0000 | MEDICATED_PATCH | Freq: Once | TRANSDERMAL | Status: DC

## 2017-08-22 MED ORDER — DIBUCAINE 1 % RE OINT: 1.0000 "application " | TOPICAL_OINTMENT | RECTAL | Status: DC | PRN

## 2017-08-22 MED ORDER — ONDANSETRON HCL 4 MG/2ML IJ SOLN: 4.0000 mg | Freq: Three times a day (TID) | INTRAMUSCULAR | Status: DC | PRN

## 2017-08-22 MED ORDER — NIFEDIPINE 10 MG PO CAPS
10.0000 mg | ORAL_CAPSULE | Freq: Once | ORAL | Status: AC
  Administered 2017-08-22: 10 mg via ORAL
  Filled 2017-08-22: qty 1

## 2017-08-22 MED ORDER — DIPHENHYDRAMINE HCL 50 MG/ML IJ SOLN: 12.5000 mg | INTRAMUSCULAR | Status: DC | PRN

## 2017-08-22 MED ORDER — NALOXONE HCL 0.4 MG/ML IJ SOLN: 1.0000 ug/kg/h | INTRAMUSCULAR | Status: DC | PRN

## 2017-08-22 MED ORDER — OXYTOCIN 10 UNIT/ML IJ SOLN
INTRAMUSCULAR | Status: AC
  Filled 2017-08-22: qty 4

## 2017-08-22 MED ORDER — LACTATED RINGERS IV SOLN
INTRAVENOUS | Status: DC | PRN
  Administered 2017-08-22: 13:00:00 via INTRAVENOUS

## 2017-08-22 MED ORDER — FENTANYL CITRATE (PF) 100 MCG/2ML IJ SOLN
INTRAMUSCULAR | Status: AC
  Filled 2017-08-22: qty 2

## 2017-08-22 MED ORDER — MORPHINE SULFATE (PF) 0.5 MG/ML IJ SOLN
INTRAMUSCULAR | Status: AC
  Filled 2017-08-22: qty 10

## 2017-08-22 MED ORDER — SODIUM CHLORIDE 0.9% FLUSH: 3.0000 mL | INTRAVENOUS | Status: DC | PRN

## 2017-08-22 MED ORDER — EPHEDRINE SULFATE 50 MG/ML IJ SOLN
INTRAMUSCULAR | Status: DC | PRN
  Administered 2017-08-22: 10 mg via INTRAVENOUS

## 2017-08-22 SURGICAL SUPPLY — 39 items
BENZOIN TINCTURE PRP APPL 2/3 (GAUZE/BANDAGES/DRESSINGS) ×3 IMPLANT
CHLORAPREP W/TINT 26ML (MISCELLANEOUS) ×3 IMPLANT
CLAMP CORD UMBIL (MISCELLANEOUS) IMPLANT
CLOSURE STERI STRIP 1/2 X4 (GAUZE/BANDAGES/DRESSINGS) ×2 IMPLANT
CLOSURE WOUND 1/2 X4 (GAUZE/BANDAGES/DRESSINGS) ×1
CLOTH BEACON ORANGE TIMEOUT ST (SAFETY) ×3 IMPLANT
CONTAINER PREFILL 10% NBF 15ML (MISCELLANEOUS) IMPLANT
DRSG OPSITE POSTOP 4X10 (GAUZE/BANDAGES/DRESSINGS) ×3 IMPLANT
ELECT REM PT RETURN 9FT ADLT (ELECTROSURGICAL) ×3
ELECTRODE REM PT RTRN 9FT ADLT (ELECTROSURGICAL) ×1 IMPLANT
EXTRACTOR VACUUM M CUP 4 TUBE (SUCTIONS) IMPLANT
EXTRACTOR VACUUM M CUP 4' TUBE (SUCTIONS)
GLOVE BIO SURGEON STRL SZ7.5 (GLOVE) ×3 IMPLANT
GLOVE BIOGEL PI IND STRL 7.0 (GLOVE) ×1 IMPLANT
GLOVE BIOGEL PI IND STRL 7.5 (GLOVE) ×1 IMPLANT
GLOVE BIOGEL PI INDICATOR 7.0 (GLOVE) ×2
GLOVE BIOGEL PI INDICATOR 7.5 (GLOVE) ×2
GOWN STRL REUS W/TWL LRG LVL3 (GOWN DISPOSABLE) ×6 IMPLANT
KIT ABG SYR 3ML LUER SLIP (SYRINGE) IMPLANT
NEEDLE HYPO 25X5/8 SAFETYGLIDE (NEEDLE) IMPLANT
NS IRRIG 1000ML POUR BTL (IV SOLUTION) ×3 IMPLANT
PACK C SECTION WH (CUSTOM PROCEDURE TRAY) ×3 IMPLANT
PAD OB MATERNITY 4.3X12.25 (PERSONAL CARE ITEMS) ×3 IMPLANT
PENCIL SMOKE EVAC W/HOLSTER (ELECTROSURGICAL) ×3 IMPLANT
RTRCTR C-SECT PINK 25CM LRG (MISCELLANEOUS) ×3 IMPLANT
STRIP CLOSURE SKIN 1/2X4 (GAUZE/BANDAGES/DRESSINGS) ×2 IMPLANT
SUT CHROMIC 2 0 CT 1 (SUTURE) ×3 IMPLANT
SUT MNCRL AB 3-0 PS2 27 (SUTURE) ×3 IMPLANT
SUT PLAIN 2 0 (SUTURE) ×2
SUT PLAIN 2 0 XLH (SUTURE) ×3 IMPLANT
SUT PLAIN ABS 2-0 CT1 27XMFL (SUTURE) ×1 IMPLANT
SUT VIC AB 0 CT1 36 (SUTURE) ×3 IMPLANT
SUT VIC AB 0 CTX 36 (SUTURE) ×6
SUT VIC AB 0 CTX36XBRD ANBCTRL (SUTURE) ×3 IMPLANT
SUT VIC AB 2-0 CT1 (SUTURE) ×6 IMPLANT
SUT VIC AB 2-0 SH 27 (SUTURE) ×4
SUT VIC AB 2-0 SH 27XBRD (SUTURE) ×2 IMPLANT
TOWEL OR 17X24 6PK STRL BLUE (TOWEL DISPOSABLE) ×3 IMPLANT
TRAY FOLEY BAG SILVER LF 14FR (SET/KITS/TRAYS/PACK) ×3 IMPLANT

## 2017-08-22 NOTE — Anesthesia Procedure Notes (Signed)
Spinal  Patient location during procedure: OR Start time: 08/22/2017 12:44 PM End time: 08/22/2017 12:47 PM Staffing Anesthesiologist: Cecile Hearingurk, Lindey Renzulli Edward, MD Performed: anesthesiologist  Preanesthetic Checklist Completed: patient identified, surgical consent, pre-op evaluation, timeout performed, IV checked, risks and benefits discussed and monitors and equipment checked Spinal Block Patient position: sitting Prep: site prepped and draped and DuraPrep Patient monitoring: continuous pulse ox and blood pressure Approach: midline Location: L3-4 Injection technique: single-shot Needle Needle type: Pencan  Needle gauge: 24 G Assessment Sensory level: T4 Additional Notes Functioning IV was confirmed and monitors were applied. Sterile prep and drape, including hand hygiene, mask and sterile gloves were used. The patient was positioned and the spine was prepped. The skin was anesthetized with lidocaine.  Free flow of clear CSF was obtained prior to injecting local anesthetic into the CSF.  The spinal needle aspirated freely following injection.  The needle was carefully withdrawn.  The patient tolerated the procedure well. Consent was obtained prior to procedure with all questions answered and concerns addressed. Risks including but not limited to bleeding, infection, nerve damage, paralysis, failed block, inadequate analgesia, allergic reaction, high spinal, itching and headache were discussed and the patient wished to proceed.   Arrie AranStephen Basil Buffin, MD

## 2017-08-22 NOTE — Progress Notes (Signed)
  Subjective: Patient reports intermittent vaginal bleeding.  She also feels contractions, mild intensity.  With normal fetal movement.   Objective: I have reviewed patient's vital signs. Vitals:   08/21/17 2347 08/22/17 0606 08/22/17 0735 08/22/17 1025  BP: 99/61 (!) 99/54 112/62 114/69  Pulse: 90 69 70   Resp: 18 20 16   Temp: 97.8 F (36.6 C) 97.7 F (36.5 C) 99.1 F (37.3 C)   TempSrc: Oral Oral Oral   SpO2: 98%  99%   Weight:      Height:       Gen: NAD Abd: soft, non tender Ext: warm and well perfused, no calf tenderness Speculum exam: Small pool of blood in vagina Fern test: negative Amnisure: Positive  Assessment/Plan:  LOS: 1 day   26 y/o P1 with bleeding placenta previa, preterm premature rupture of membranes, abnormal fetal heart tracing with intermittent prolonged and variable decelerations, contractions  -I discussed case with MFM, Dr. Newman.  Will proceed with Primary Cesarean section delivery for above indications for concern of fetal and maternal status.  I discussed with patient risks, benefits and alternatives of cesarean section including risks of heavy bleeding, infection, damage to organs. We discussed possibly requiring a blood transfusion, patient will be agreeable to transfusion.  All her questions were answered and she signed the consent form.  Antonella Upson WAKURU, MD.  08/22/2017, 11:43 AM     

## 2017-08-22 NOTE — Op Note (Signed)
Patient:  Lavonda Jumboerrin, Angeliki DOB: 13-Dec-1990 MRN:  [id number]  DATE OF SURGERY: 08/22/2017   PREOP DIAGNOSIS:  1. 35 week 2 day  EGA intrauterine pregnancy 2. Bleeding placenta previa.  3. Preterm Premature rupture of membranes.  4.  Abnormal fetal heart tracing with intermittent variable and prolonged decelerations.     POSTOP DIAGNOSIS: Same as above.  PROCEDURE: Primary low uterine segment transverse cesarean section via Pfannenstiel incision.     SURGEON: Dr.  Hoover BrownsEMA Sanam Marmo  ASSISTANT: Dr. Osborn CohoAngela Roberts  ANESTHESIA: Spinal  COMPLICATIONS: None  FINDINGS: Viable female infant in cephalic presentation, DOP, weight 5 pounds 9.4 ounces, Apgar scores of 8 and 8. Normal uterus and fallopian tubes and ovaries bilaterally.    EBL:  895 cc  IV FLUID:  2200 cc LR   URINE OUTPUT: 650  cc clear urine  INDICATIONS: 26 y/o P1 who presented with vaginal bleeding, known placenta previa and contractions.  She had PPROM and abnormal tracing with intermittent prolonged and variable decelerations.  She had received two betamethasone injections for fetal lung maturity.  It was decided to proceed with delivery after consultation with MFM.   PROCEDURE:   Informed consent was obtained from the patient to undergo the procedure. She was taken to the operating room where her spinal anesthesia was found to be adequate. She was prepped and draped in the usual sterile fashion and a Foley catheter was placed. She received 2 g of IV Ancef preoperatively. A Pfannenstiel incision was made with the scalpel and the incision extended through the subcutaneous layer and also the fascia with the bovie. Small perforators in the subcutaneous layer were contained with the Bovie. The fascia was nicked in the midline and then was further separated from the rectus muscles bilaterally using Mayo scissors. Kochers were placed inferiorly and then superiorly to allow further separation of fascia from the rectus muscles.  The  peritoneal cavity was entered bluntly with the fingers. The Alexis retractor was placed in. The bladder flap was created using Metzenbaum scissors.   The uterus was incised with a scalpel and the incision extended bluntly bilaterally with fingers. Clear amniotic fluid was noted.  The head then the rest of the body was then delivered with abdominal pressure.  She delivered a viable female infant, apgar scores 8,9.  The cord was clamped after 1 minute and cut. Cord blood was collected.    The uterus was not exteriorized.  The edges of the uterus was grasped with Allis clamps and also T. Clamps. The placenta was manually removed by cleavage from the uterus.  The uterus was cleared of clots and debris with a lap.  The  uterine incision was closed with #1 Vicryl in a running locked stitch. An imbricating layer of same stitch was placed over the initial closure.  A small area that bled on the middle was contained with figure of 8 stitch.  Irrigation was applied and suctioned out. Excellent hemostasis was noted over the incision.  The muscles and peritoneum were then reapproximated using chromic suture,interrupted.  Fascia was closed using 0 Vicryl in a running stitch. The subcutaneous layer was irrigated and suctioned out. Small perforators were contained with the bovie.  The subcutaneous was closed over using 1-0 plain in running stitch. The skin was closed using 4-0 Monocryl. Dermabond was applied. Honeycomb was then applied. The patient was then cleaned and she was taken to the recovery room in stable condition. The neonate was also taken to the nursery in stable condition.  SPECIMEN: Placenta, umbilical cord blood  DISPOSITION: TO PACU, STABLE.

## 2017-08-22 NOTE — H&P (View-Only) (Signed)
  Subjective: Patient reports intermittent vaginal bleeding.  She also feels contractions, mild intensity.  With normal fetal movement.   Objective: I have reviewed patient's vital signs. Vitals:   08/21/17 2347 08/22/17 0606 08/22/17 0735 08/22/17 1025  BP: 99/61 (!) 99/54 112/62 114/69  Pulse: 90 69 70   Resp: 18 20 16    Temp: 97.8 F (36.6 C) 97.7 F (36.5 C) 99.1 F (37.3 C)   TempSrc: Oral Oral Oral   SpO2: 98%  99%   Weight:      Height:       Gen: NAD Abd: soft, non tender Ext: warm and well perfused, no calf tenderness Speculum exam: Small pool of blood in vagina Fern test: negative Amnisure: Positive  Assessment/Plan:  LOS: 1 day   26 y/o P1 with bleeding placenta previa, preterm premature rupture of membranes, abnormal fetal heart tracing with intermittent prolonged and variable decelerations, contractions  -I discussed case with MFM, Dr. Ezzard StandingNewman.  Will proceed with Primary Cesarean section delivery for above indications for concern of fetal and maternal status.  I discussed with patient risks, benefits and alternatives of cesarean section including risks of heavy bleeding, infection, damage to organs. We discussed possibly requiring a blood transfusion, patient will be agreeable to transfusion.  All her questions were answered and she signed the consent form.  Konrad FelixKULWA,Nelta Caudill WAKURU, MD.  08/22/2017, 11:43 AM

## 2017-08-22 NOTE — Progress Notes (Signed)
Pt rating pain a 7 out of 10. Pt having pain in incisional area. Called Dr. Sallye OberKulwa to see what she recommended as far as pain control. Ordered to give ibuprofen now instead of 1800. Will continue to monitor.

## 2017-08-22 NOTE — Transfer of Care (Signed)
Immediate Anesthesia Transfer of Care Note  Patient: Margaret [name]  Procedure(s) Performed: PRIMARY CESAREAN SECTION (N/A Abdomen)  Patient Location: PACU  Anesthesia Type:Spinal  Level of Consciousness: awake and patient cooperative  Airway & Oxygen Therapy: Patient Spontanous Breathing  Post-op Assessment: Report given to RN and Post -op Vital signs reviewed and stable  Post vital signs: Reviewed and stable  Last Vitals:  Vitals:   08/22/17 1025 08/22/17 1407  BP: 114/69   Pulse:    Resp:    Temp:  37.1 C  SpO2:      Last Pain:  Vitals:   08/22/17 1407  TempSrc: Oral  PainSc:       Patients Stated Pain Goal: 0 (08/21/17 2304)  Complications: No apparent anesthesia complications

## 2017-08-22 NOTE — Brief Op Note (Addendum)
08/22/2017  2:58 PM  PATIENT:  Lavonda JumboAlicia Stange  26 y.o. female  PRE-OPERATIVE DIAGNOSIS:  bleeding placenta previa, PPROM, variable and prolonged decels  POST-OPERATIVE DIAGNOSIS:  bleeding placenta previa, PPROM, variable decels  PROCEDURE:  Procedure(s): PRIMARY CESAREAN SECTION (N/A)  SURGEON:  Surgeon(s) and Role:    * Hoover BrownsKulwa, Maricel Swartzendruber, MD - Primary    * Osborn Cohooberts, Angela, MD - Assisting  ANESTHESIA:   spinal  EBL:  895 cc   BLOOD ADMINISTERED:none  DRAINS: none   LOCAL MEDICATIONS USED:  NONE  SPECIMEN:  Source of Specimen:  Placenta, Cord blood.   DISPOSITION OF SPECIMEN:  PATHOLOGY  COUNTS:  YES  TOURNIQUET:  * No tourniquets in log *  DICTATION: .Note written in EPIC  PLAN OF CARE: Admit to inpatient   PATIENT DISPOSITION:  PACU - hemodynamically stable.   Delay start of Pharmacological VTE agent (>24hrs) due to surgical blood loss or risk of bleeding: not applicable

## 2017-08-22 NOTE — Anesthesia Preprocedure Evaluation (Signed)
Anesthesia Evaluation  Patient identified by MRN, date of birth, ID band Patient awake    Reviewed: Allergy & Precautions, NPO status , Patient's Chart, lab work & pertinent test results  Airway Mallampati: II  TM Distance: >3 FB Neck ROM: Full    Dental  (+) Teeth Intact, Dental Advisory Given   Pulmonary neg pulmonary ROS,    Pulmonary exam normal breath sounds clear to auscultation       Cardiovascular negative cardio ROS Normal cardiovascular exam Rhythm:Regular Rate:Normal     Neuro/Psych negative neurological ROS     GI/Hepatic negative GI ROS, Neg liver ROS,   Endo/Other  Obesity   Renal/GU negative Renal ROS     Musculoskeletal negative musculoskeletal ROS (+)   Abdominal   Peds  Hematology  (+) Blood dyscrasia, anemia , Plt 209k   Anesthesia Other Findings Day of surgery medications reviewed with the patient.  Reproductive/Obstetrics (+) Pregnancy Complete placenta previa                              Anesthesia Physical Anesthesia Plan  ASA: II and emergent  Anesthesia Plan: Spinal   Post-op Pain Management:    Induction:   PONV Risk Score and Plan: 2 and Treatment may vary due to age or medical condition, Dexamethasone and Ondansetron  Airway Management Planned:   Additional Equipment:   Intra-op Plan:   Post-operative Plan:   Informed Consent: I have reviewed the patients History and Physical, chart, labs and discussed the procedure including the risks, benefits and alternatives for the proposed anesthesia with the patient or authorized representative who has indicated his/her understanding and acceptance.   Dental advisory given  Plan Discussed with: CRNA, Anesthesiologist and Surgeon  Anesthesia Plan Comments: (Discussed risks and benefits of and differences between spinal and general. Discussed risks of spinal including headache, backache, failure,  bleeding, infection, and nerve damage. Patient consents to spinal. Questions answered. Coagulation studies and platelet count acceptable.  Spoke with Dr. Sallye OberKulwa regarding urgency of case.  She stated it was acceptable to await recent labs due to her desire to avoid proceeding under general anesthesia.)        Anesthesia Quick Evaluation

## 2017-08-22 NOTE — Consult Note (Signed)
I discussed this case with Dr. Normand Sloopillard and reviewed the patient's hospital course and monitoring. I reviewed this patient's US scan yesterday and confirmed a marginal placenta previa Since that time she has continued to have intermittent bright red bleeding and contractions about every 10 minutes. She has had multiple variable decelerations over the last 8-10 hours, some prolonged. She is due her second betamethasone dose today at 1500. My recommendation to Dr. Normand Sloopdillard was to begin nifedipine 10mg  po q6h to quiet the uterus and decrease the risk of further bleeding, and to continue her on continuous monitoring. If she has worsening bleeding or worrisome decelerations I recommend proceeding with delivery by C/S. If she remains stable I recommend delivery tomorrow afternoon c. 24 hours after her second betamethasone dose. There is no significant advantage to accrue to delaying delivery to 36+ weeks, as would be standard for a placenta previa. The Society for Maternal-Fetal Medicine Consult Series #44, " Management of bleeding in the late preterm period", recoomed delivery after 34 weeks for "significant" vaginal bleeding in a patient with placenta previa

## 2017-08-22 NOTE — Progress Notes (Addendum)
Hospital day # 1 pregnancy at 4050w1d  S: well, reports good fetal activity      Contractions:none, regular, every 5 - 010 minutes      Vaginal bleeding heavier than period       Vaginal discharge: bloody   O: BP 112/62   Pulse 70   Temp 99.1 F (37.3 C) (Oral)   Resp 16   Ht 5\' 3"  (1.6 m)   Wt 171 lb (77.6 kg)   LMP  (Approximate)   SpO2 99%   Breastfeeding? No   BMI 30.29 kg/m       Fetal tracings:Cat 2 Prolonged Deceleration as I walked into room with good variability with return to baseline;       Uterus non-tender      Extremities: no significant edema and no signs of DVT  A: 2750w1d with placenta previa     gradually worsening  P: Dr Sallye OberKulwa made aware of deceleration and had assumed care at 700am.      Requested that she review all of the EFM strip  Margaret [name] A Margaret [name] CNM 08/22/2017 8:03 AM

## 2017-08-22 NOTE — Interval H&P Note (Signed)
History and Physical Interval Note:  08/22/2017 12:31 PM  Margaret [name]  has presented today for surgery, with the diagnosis of Marginal Placenta Previa, Preterm premature rupture of membranes, abnormal fetal heart tracing.  The various methods of treatment have been discussed with the patient and family. After consideration of risks, benefits and other options for treatment, the patient has consented to  Procedure(s): PRIMARY CESAREAN SECTION (N/A) as a surgical intervention .  The patient's history has been reviewed, patient examined, no change in status, stable for surgery.  I have reviewed the patient's chart and labs.  Questions were answered to the patient's satisfaction.     Konrad FelixKULWA,Carsten Carstarphen WAKURU, MD.

## 2017-08-22 NOTE — Anesthesia Postprocedure Evaluation (Signed)
Anesthesia Post Note  Patient: Margaret [name]  Procedure(s) Performed: PRIMARY CESAREAN SECTION (N/A Abdomen)     Patient location during evaluation: PACU Anesthesia Type: Spinal Level of consciousness: oriented and awake and alert Pain management: pain level controlled Vital Signs Assessment: post-procedure vital signs reviewed and stable Respiratory status: spontaneous breathing, respiratory function stable and patient connected to nasal cannula oxygen Cardiovascular status: blood pressure returned to baseline and stable Postop Assessment: no headache, no backache, no apparent nausea or vomiting, spinal receding and patient able to bend at knees Anesthetic complications: no    Last Vitals:  Vitals:   08/22/17 1514 08/22/17 1608  BP: 105/69 (!) 121/59  Pulse: 66 73  Resp: 16 16  Temp: 36.6 C 37.1 C  SpO2: 97% 96%    Last Pain:  Vitals:   08/22/17 1608  TempSrc: Oral  PainSc:    Pain Goal: Patients Stated Pain Goal: 2 (08/22/17 1600)               Cecile HearingStephen Edward Geralene Afshar

## 2017-08-23 LAB — CBC
HEMATOCRIT: 23 % — AB (ref 36.0–46.0)
Hemoglobin: 7.7 g/dL — ABNORMAL LOW (ref 12.0–15.0)
MCH: 27.9 pg (ref 26.0–34.0)
MCHC: 33.5 g/dL (ref 30.0–36.0)
MCV: 83.3 fL (ref 78.0–100.0)
Platelets: 168 10*3/uL (ref 150–400)
RBC: 2.76 MIL/uL — AB (ref 3.87–5.11)
RDW: 13.8 % (ref 11.5–15.5)
WBC: 17.5 10*3/uL — AB (ref 4.0–10.5)

## 2017-08-23 NOTE — Lactation Note (Signed)
This note was copied from a baby's chart. Lactation Consultation Note  Patient Name: Margaret [name] ZOXWR'UToday's Date: 08/23/2017 Reason for consult: Initial assessment;Late-preterm 34-36.6wks;Infant < 6lbs Infant is 827 hours old & seen by Lactation. Baby was born at 3420w1d and weighed 5 lbs 9.4 oz at birth. [facility] Mom reports she BF with her first child for ~4170m (breast and formula) but had problems with supply. Mom reports BF is going well and that she is BF and supplementing most feedings but occ. is only using formula. Mom reports baby BF for ~1hr at the last feeding but that baby kept falling asleep during the feeding. Mom reports she has only pumped 1x today. Mom reports a little breast/ nipple tenderness and is using coconut oil. Provided mom with BF booklet, BF resources, feeding log, and LPI guidelines. Mom made aware of O/P services, breastfeeding support groups, community resources, and our phone # for post-discharge questions.  Reviewed LPI guidelines and encouraged mom to BF at least q 3hrs or sooner with feeding cues and to supplement afterwards per LPI guidelines. Encouraged mom to limit BF to 30 mins/ session. Encouraged mom to post pump for ~10-15 mins or if baby does not latch- to pump for 15-20 mins followed by hand expression.  Mom reports she has a double electric pump at home but is unsure of the brand. Mom reports no questions. Encouraged mom to ask for help as needed.  Maternal Data Does the patient have breastfeeding experience prior to this delivery?: Yes  Feeding    LATCH Score                   Interventions    Lactation Tools Discussed/Used WIC Program: Yes   Consult Status Consult Status: Follow-up Date: 08/24/17 Follow-up type: In-patient    Oneal GroutLaura C Quinnlyn Hearns 08/23/2017, 5:05 PM

## 2017-08-23 NOTE — Plan of Care (Signed)
  Education: Knowledge of General Education information will improve 08/23/2017 0055 - Progressing by Pietro CassisHollis, Shrey Boike A, RN   Progressing Education: Knowledge of General Education information will improve 08/23/2017 0055 - Progressing by Pietro CassisHollis, Eiliyah Reh A, RN Clinical Measurements: Will remain free from infection 08/23/2017 0055 - Progressing by Pietro CassisHollis, Avedis Bevis A, RN Note Discussed hand washing and staying away from people who are sick Respiratory complications will improve 08/23/2017 0055 - Progressing by Pietro CassisHollis, Zaydn Gutridge A, RN Note Pt using incentive spirometer Activity: Risk for activity intolerance will decrease 08/23/2017 0055 - Progressing by Pietro CassisHollis, Millisa Giarrusso A, RN Note Pt OOB tolerated well

## 2017-08-23 NOTE — Lactation Note (Signed)
This note was copied from a baby's chart. Lactation Consultation Note Baby 12 hrs old. RN called LC concerned baby wasn't interested in BF. Baby is being supplemented according to LPI policy. DEBP has been set up by RN and mom has pumped. Mom and baby resting at this time. LC encouraged STS, and hand expression would been seen in am.  Patient Name: Margaret [name] Today's Date: 08/23/2017     Maternal Data Does the patient have breastfeeding experience prior to this delivery?: Yes  Feeding Feeding Type: Breast Fed  LATCH Score                   Interventions    Lactation Tools Discussed/Used Initiated by:: mary hollis  Date initiated:: 08/22/17   Consult Status      Nargis Abrams G 08/23/2017, 1:18 AM

## 2017-08-23 NOTE — Addendum Note (Signed)
Addendum  created 08/23/17 0745 by Angela AdamWrinkle, Anylah Scheib G, CRNA   Sign clinical note

## 2017-08-23 NOTE — Anesthesia Postprocedure Evaluation (Signed)
Anesthesia Post Note  Patient: Margaret [name]  Procedure(s) Performed: PRIMARY CESAREAN SECTION (N/A Abdomen)     Patient location during evaluation: [facility] Anesthesia Type: Spinal Level of consciousness: awake and alert Pain management: satisfactory to patient Vital Signs Assessment: post-procedure vital signs reviewed and stable Respiratory status: spontaneous breathing Cardiovascular status: blood pressure returned to baseline Postop Assessment: no headache, spinal receding, patient able to bend at knees and no apparent nausea or vomiting Anesthetic complications: no Comments: Pain Score 2.    Last Vitals:  Vitals:   08/22/17 2336 08/23/17 0330  BP: 110/62 105/60  Pulse: 75 70  Resp: 18 18  Temp: 36.9 C 37 C  SpO2: 96% 96%    Last Pain:  Vitals:   08/23/17 0632  TempSrc:   PainSc: 0-No pain   Pain Goal: Patients Stated Pain Goal: 2 (08/22/17 1700)               Merrilyn PumaWRINKLE,Pate Aylward

## 2017-08-23 NOTE — Progress Notes (Signed)
Lavonda Jumbolicia Fenstermaker [id number] Postpartum Day 1 S/P Primary Cesarean Section due to Placenta Previa, PPROM, and Abnormal FHT  Subjective: Patient up ad lib, denies syncope or dizziness. Reports consuming regular diet without issues and denies N/V. Patient reports no bowel movement or passing flatus.  Denies issues with urination and reports bleeding is "okay."  Patient is breastfeeding and reports going well.  Desires pills for postpartum contraception.  Pain is being appropriately managed with use of tylenol and ibuprofen .   Objective: Temp:  [97.3 F (36.3 C)-99.1 F (37.3 C)] 98.6 F (37 C) (12/14 0330) Pulse Rate:  [66-87] 70 (12/14 0330) Resp:  [16-31] 18 (12/14 0330) BP: (97-121)/(49-81) 105/60 (12/14 0330) SpO2:  [95 %-99 %] 96 % (12/14 0330)  Recent Labs    08/21/17 0946 08/21/17 1205 08/22/17 1155  HGB 10.3* 10.1* 9.8*  HCT 31.5* 30.4* 29.9*  WBC 8.1 11.1* 14.2*    Physical Exam:  General: alert, cooperative and no distress Mood/Affect: Appropriate/Appropriate Lungs: clear to auscultation, no wheezes, rales or rhonchi, symmetric air entry.  Heart: normal rate and regular rhythm. Breast: not examined. Abdomen:  + bowel sounds, Soft, Appropriately Tender Incision: no significant drainage, Honeycomb dressing  Uterine Fundus: firm, U/-2 Lochia: appropriate Foley Catheter in Place Skin: Warm, Dry. DVT Evaluation: No evidence of DVT seen on physical exam. Calf/Ankle edema is present. JP drain:   None  Assessment Post Operative Day 1 S/P Primary C/S Normal Involution BreastFeeding Hemodynamically Stable  Plan: -Removal of foley catheter -Ambulation as tolerated -Initiate regular diet -Pain medication as necessary -Continue other mgmt as ordered -Dr. Alinda SierrasAR to be updated on patient status  Cherre RobinsJessica L Salem Lembke MSN, CNM 08/23/2017, 5:28 AM

## 2017-08-24 LAB — TYPE AND SCREEN
ABO/RH(D): A POS
ANTIBODY SCREEN: NEGATIVE

## 2017-08-24 MED ORDER — ACETAMINOPHEN 325 MG PO TABS: 650.0000 mg | ORAL_TABLET | ORAL | 4 refills | Status: AC | PRN

## 2017-08-24 MED ORDER — NORETHINDRONE 0.35 MG PO TABS: 1.0000 | ORAL_TABLET | Freq: Every day | ORAL | 11 refills | Status: AC

## 2017-08-24 MED ORDER — IBUPROFEN 600 MG PO TABS: 600.0000 mg | ORAL_TABLET | Freq: Four times a day (QID) | ORAL | 0 refills | Status: AC

## 2017-08-24 NOTE — Progress Notes (Signed)
Discharge instructions reviewed with patient.  Patient states understanding of home care, medications, activity, signs/symptoms to report to MD and return MD office visit.  Patients significant other and family will assist with her care @ home.  No home  equipment needed, patient has prescriptions and all personal belongings.  Patient will room in with baby.

## 2017-08-24 NOTE — Discharge Instructions (Signed)
Postpartum Care After Cesarean Delivery °The period of time right after you deliver your newborn is called the postpartum period. °What kind of medical care will I receive? °· You may continue to receive fluids and medicines through an IV tube inserted into one of your veins. °· You may have small, flexible tube (catheter) draining urine from your bladder into a bag outside of your body. The catheter will be removed as soon as possible. °· You may be given a squirt bottle to use when you go to the bathroom. You may use this until you are comfortable wiping as usual. To use the squirt bottle, follow these steps: °? Before you urinate, fill the squirt bottle with warm water. The water should be warm. Do not use hot water. °? After you urinate, while you are sitting on the toilet, use the squirt bottle to rinse the area around your urethra and vaginal opening. This rinses away any urine and blood. °? You may do this instead of wiping. As you start healing, you may use the squirt bottle before wiping yourself. Make sure to wipe gently. °? Fill the squirt bottle with clean water every time you use the bathroom. °· You will be given sanitary pads to wear. °· Your incision will be monitored to make sure it is healing properly. You will be told when it is safe for your stitches, staples, or skin adhesive tape to be removed. °What can I expect? °· You may not feel the need to urinate for several hours after delivery. °· You will have some soreness and pain in your abdomen. You may have a small amount of blood or clear fluid coming from your incision. °· If you are breastfeeding, you may have uterine contractions every time you breastfeed for up to several weeks postpartum. Uterine contractions help your uterus return to its normal size. °· It is normal to have vaginal bleeding (lochia) after delivery. The amount and appearance of lochia is often similar to a menstrual period in the first week after delivery. It will  gradually decrease over the next few weeks to a dry, yellow-brown discharge. For most women, lochia stops completely by 6-8 weeks after delivery. Vaginal bleeding can vary from woman to woman. °· Within the first few days after delivery, you may have breast engorgement. This is when your breasts feel heavy, full, and uncomfortable. Your breasts may also throb and feel hard, tightly stretched, warm, and tender. After this occurs, you may have milk leaking from your breasts. Your health care provider can help you relieve discomfort due to breast engorgement. Breast engorgement should go away within a few days. °· You may feel more sad or worried than normal due to hormonal changes after delivery. These feelings should not last more than a few days. If these feelings do not go away after several days, speak with your health care provider. °How should I care for myself? °· Tell your health care provider if you have pain or discomfort. °· Drink enough water to keep your urine clear or pale yellow. °· Wash your hands thoroughly with soap and water for at least 20 seconds after changing your sanitary pads or using the toilet, and before holding or feeding your baby. °· If you are not breastfeeding, avoid touching your breasts a lot. Doing this can make your breasts produce more milk. °· If you become weak or lightheaded, or you feel like you might faint, ask for help before: °? Getting out of bed. °? Showering. °·   Change your sanitary pads frequently. Watch for any changes in your flow, such as a sudden increase in volume, a change in color, or the passing of large blood clots. If you pass a blood clot from your vagina, save it to show to your health care provider. Do not flush blood clots down the toilet without having your health care provider look at them.  Make sure that all your vaccinations are up to date. This can help protect you and your baby from getting certain diseases. You may need to have immunizations done  before you leave the hospital.  If desired, talk with your health care provider about methods of family planning or birth control (contraception). How can I start bonding with my baby? Spending as much time as possible with your baby is very important. During this time, you and your baby can get to know each other and develop a bond. Having your baby stay with you in your room (rooming in) can give you time to get to know your baby. Rooming in can also help you become comfortable caring for your baby. Breastfeeding can also help you bond with your baby. How can I plan for returning home with my baby?  Make sure that you have a car seat installed in your vehicle. ? Your car seat should be checked by a certified car seat installer to make sure that it is installed safely. ? Make sure that your baby fits into the car seat safely.  Ask your health care provider any questions you have about caring for yourself or your baby. Make sure that you are able to contact your health care provider with any questions after leaving the hospital. This information is not intended to replace advice given to you by your health care provider. Make sure you discuss any questions you have with your health care provider. Document Released: 05/21/2012 Document Revised: 01/30/2016 Document Reviewed: 08/01/2015 Elsevier Interactive Patient Education  2018 [employer] Norethindrone tablets (contraception) What is this medicine? NORETHINDRONE (nor eth IN drone) is an oral contraceptive. The product contains a female hormone known as a progestin. It is used to prevent pregnancy. This medicine may be used for other purposes; ask your health care provider or pharmacist if you have questions. COMMON BRAND NAME(S): Camila, Deblitane 28-Day, Errin, Heather, Dunn[place]Jencycla, Jolivette, [place], Nor-QD, Nora-BE, Norlyroc, Ortho Micronor, [employer]ard[employer]arobel 28-Day What should I tell my health care provider before I take this medicine? They need to  know if you have any of these conditions: -blood vessel disease or blood clots -breast, cervical, or vaginal cancer -diabetes -heart disease -kidney disease -liver disease -mental depression -migraine -seizures -stroke -vaginal bleeding -an unusual or allergic reaction to norethindrone, other medicines, foods, dyes, or preservatives -pregnant or trying to get pregnant -breast-feeding How should I use this medicine? Take this medicine by mouth with a glass of water. You may take it with or without food. Follow the directions on the prescription label. Take this medicine at the same time each day and in the order directed on the package. Do not take your medicine more often than directed. Contact your pediatrician regarding the use of this medicine in children. Special care may be needed. This medicine has been used in female children who have started having menstrual periods. A patient package insert for the product will be given with each prescription and refill. Read this sheet carefully each time. The sheet may change frequently. Overdosage: If you think you have taken too much of this medicine  contact a poison control center or emergency room at once. NOTE: This medicine is only for you. Do not share this medicine with others. What if I miss a dose? Try not to miss a dose. Every time you miss a dose or take a dose late your chance of pregnancy increases. When 1 pill is missed (even if only 3 hours late), take the missed pill as soon as possible and continue taking a pill each day at the regular time (use a back up method of birth control for the next 48 hours). If more than 1 dose is missed, use an additional birth control method for the rest of your pill pack until menses occurs. Contact your health care professional if more than 1 dose has been missed. What may interact with this medicine? Do not take this medicine with any of the following medications: -amprenavir or  fosamprenavir -bosentan This medicine may also interact with the following medications: -antibiotics or medicines for infections, especially rifampin, rifabutin, rifapentine, and griseofulvin, and possibly penicillins or tetracyclines -aprepitant -barbiturate medicines, such as phenobarbital -carbamazepine -felbamate -modafinil -oxcarbazepine -phenytoin -ritonavir or other medicines for HIV infection or AIDS -St. John's wort -topiramate This list may not describe all possible interactions. Give your health care provider a list of all the medicines, herbs, non-prescription drugs, or dietary supplements you use. Also tell them if you smoke, drink alcohol, or use illegal drugs. Some items may interact with your medicine. What should I watch for while using this medicine? Visit your doctor or health care professional for regular checks on your progress. You will need a regular breast and pelvic exam and Pap smear while on this medicine. Use an additional method of birth control during the first cycle that you take these tablets. If you have any reason to think you are pregnant, stop taking this medicine right away and contact your doctor or health care professional. If you are taking this medicine for hormone related problems, it may take several cycles of use to see improvement in your condition. This medicine does not protect you against HIV infection (AIDS) or any other sexually transmitted diseases. What side effects may I notice from receiving this medicine? Side effects that you should report to your doctor or health care professional as soon as possible: -breast tenderness or discharge -pain in the abdomen, chest, groin or leg -severe headache -skin rash, itching, or hives -sudden shortness of breath -unusually weak or tired -vision or speech problems -yellowing of skin or eyes Side effects that usually do not require medical attention (report to your doctor or health care  professional if they continue or are bothersome): -changes in sexual desire -change in menstrual flow -facial hair growth -fluid retention and swelling -headache -irritability -nausea -weight gain or loss This list may not describe all possible side effects. Call your doctor for medical advice about side effects. You may report side effects to FDA at 1-800-FDA-1088. Where should I keep my medicine? Keep out of the reach of children. Store at room temperature between 15 and 30 degrees C (59 and 86 degrees F). Throw away any unused medicine after the expiration date. NOTE: This sheet is a summary. It may not cover all possible information. If you have questions about this medicine, talk to your doctor, pharmacist, or health care provider.  2018 Elsevier/Gold Standard (2012-05-16 16:41:35)

## 2017-08-24 NOTE — Discharge Summary (Signed)
OB Discharge Summary     Patient Name: Margaret [name] DOB: 02/15/1991 MRN: [id number]  Date of admission: 08/21/2017 Delivering MD: Margaret [name]   Date of discharge: 08/24/2017  Admitting diagnosis: 35 weeks preg; Urinating Blood Intrauterine pregnancy: 828w1d     Secondary diagnosis:  Active Problems:   Placenta previa antepartum, third trimester   Cesarean delivery delivered  Additional problems: None     Discharge diagnosis: Term Pregnancy Delivered and S/P Primary C/S                                                                                                Post partum procedures:None  Augmentation: None  Complications: None  Hospital course:  Onset of Labor With Unplanned C/S  26 y.o. yo [id number] at 2528w1d was admitted in Active Labor on 08/21/2017. Patient had a labor course significant for PPROM. Membrane Rupture Time/Date:   ,    The patient went for cesarean section due to Previa and Non-Reassuring FHR, and delivered a Viable infant,08/22/2017  Details of operation can be found in separate operative note. Patient had an uncomplicated postpartum course.  She is ambulating,tolerating a regular diet, passing flatus, and urinating well.  Patient is discharged home in stable condition 08/24/17.  Physical exam  Vitals:   08/23/17 1317 08/23/17 1609 08/23/17 1940 08/24/17 0513  BP: 120/62 (!) 105/58 115/73 113/64  Pulse: (!) 104 95 74 78  Resp: 18 18 18    Temp: 98.1 F (36.7 C) 98.7 F (37.1 C) 98.5 F (36.9 C) 99.6 F (37.6 C)  TempSrc: Axillary Oral Oral Oral  SpO2: 98% 98% 99% 96%  Weight:      Height:       General: alert, cooperative and no distress Chest: HRRR, Lungs CTA Abd: Soft, +BS Lochia: appropriate Uterine Fundus: firm Incision: Dressing is clean, dry, and intact DVT Evaluation: No evidence of DVT seen on physical exam. No significant calf/ankle edema. Labs: Lab Results  Component Value Date   WBC 17.5 (H) 08/23/2017   HGB 7.7 (L) 08/23/2017    HCT 23.0 (L) 08/23/2017   MCV 83.3 08/23/2017   PLT 168 08/23/2017   CMP Latest Ref Rng & Units 08/21/2017  Glucose 65 - 99 mg/dL 74  BUN 6 - 20 mg/dL 5(L)  Creatinine 8.410.44 - 1.00 mg/dL 6.600.51  Sodium 630135 - 160145 mmol/L 137  Potassium 3.5 - 5.1 mmol/L 3.4(L)  Chloride 101 - 111 mmol/L 108  CO2 22 - 32 mmol/L 18(L)  Calcium 8.9 - 10.3 mg/dL 8.0(L)  Total Protein 6.5 - 8.1 g/dL 6.3(L)  Total Bilirubin 0.3 - 1.2 mg/dL 0.8  Alkaline Phos 38 - 126 U/L 149(H)  AST 15 - 41 U/L 12(L)  ALT 14 - 54 U/L 9(L)    Discharge instruction: per After Visit Summary and "Baby and Me Booklet". Incision care, Postpartum Depression, Activity Restrictions, Breastfeeding, Pain Mgmt, Who and when to call for PP Concerns, Follow Up Appointment, Information Sheet Given PPD& BB, Care after C/S, POP.   After visit meds:  Allergies as of 08/24/2017   No Known Allergies  Medication List    TAKE these medications   acetaminophen 325 MG tablet Commonly known as:  TYLENOL Take 2 tablets (650 mg total) by mouth every 4 (four) hours as needed (for pain scale < 4  OR  temperature  >/=  100.5 F). What changed:    when to take this  reasons to take this   ibuprofen 600 MG tablet Commonly known as:  ADVIL,MOTRIN Take 1 tablet (600 mg total) by mouth every 6 (six) hours.   norethindrone 0.35 MG tablet Commonly known as:  ORTHO MICRONOR Take 1 tablet (0.35 mg total) by mouth daily.   prenatal multivitamin Tabs tablet Take 1 tablet by mouth daily at 12 noon.            Discharge Care Instructions  (From admission, onward)        Start     Ordered   08/24/17 0000  Discharge wound care:    Comments:  Do not scrub incision site. Keep site dry Remove all steri strips after 7 days   08/24/17 0654      Diet: routine diet  Activity: Advance as tolerated. Pelvic rest for 6 weeks.   Outpatient follow up:6 weeks Follow up Appt:No future appointments. Follow up Visit:No Follow-up on  file.  Postpartum contraception: Progesterone only pills  Newborn Data: Live born female  Birth Weight: 5 lb 9.4 oz (2535 g) APGAR: 8, 8  Newborn Delivery   Birth date/time:  08/22/2017 13:15:00 Delivery type:  C-Section, Low Transverse C-section categorization:  Primary     Baby Feeding: Bottle and Breast Disposition:home with mother   08/24/2017 Margaret [name], CNM

## 2017-08-24 NOTE — Lactation Note (Signed)
This note was copied from a baby's chart. Lactation Consultation Note  Patient Name: Margaret [name] ZOXWR'UToday's Date: 08/24/2017 Reason for consult: Follow-up assessment Baby at 46 hr of life. Upon entry mom was finishing a bottle feeding of formula. Mom reports baby is latching well. She denies breast or nipple pain. She has been post pumping with DEBP. She has a DEBP at home that she knows how to use. Discussed rental options of DEBP and [facility]. Discussed baby behavior, feeding frequency, pumping, baby belly size, supplementing, voids, wt loss, breast changes, and nipple care. Mom will offer the breast on demand q3hr for at least 10 minutes each, post pump, and supplement with formula/expressed milk per volume guidelines. Mom stated she "is waiting on the Peds to go home tonight". If mom does go home tonight she was instructed to make a f/u with lactation on 08/26/17.    Maternal Data    Feeding Feeding Type: Bottle Fed - Formula Nipple Type: Slow - flow Length of feed: 15 min  LATCH Score Latch: Grasps breast easily, tongue down, lips flanged, rhythmical sucking.  Audible Swallowing: Spontaneous and intermittent  Type of Nipple: Everted at rest and after stimulation  Comfort (Breast/Nipple): Soft / non-tender  Hold (Positioning): No assistance needed to correctly position infant at breast.  LATCH Score: 10  Interventions    Lactation Tools Discussed/Used     Consult Status Consult Status: Follow-up Date: 08/25/17 Follow-up type: In-patient    Rulon Eisenmengerlizabeth E Camera Krienke 08/24/2017, 12:05 PM

## 2017-08-25 ENCOUNTER — Ambulatory Visit: Payer: Self-pay

## 2017-08-25 LAB — TYPE AND SCREEN
ABO/RH(D): A POS
Antibody Screen: NEGATIVE
UNIT DIVISION: 0
Unit division: 0

## 2017-08-25 LAB — BPAM RBC
Blood Product Expiration Date: 201901062359
Blood Product Expiration Date: 201901062359
UNIT TYPE AND RH: 6200
UNIT TYPE AND RH: 6200

## 2017-08-25 LAB — RPR: RPR Ser Ql: NONREACTIVE

## 2017-08-25 NOTE — Lactation Note (Signed)
This note was copied from a baby's chart. Lactation Consultation Note  Patient Name: Margaret [name] JYNWG'NToday's Date: 08/25/2017 Reason for consult: Follow-up assessment;Infant < 6lbs;Late-preterm 34-36.6wks;Other (Comment)(per mom pumped x 1 in the last 24 hours )  Per mom still breast / formula  LC recommended to mom feeding the baby 15 -20 mins and supplementing increasing volume to 30 ml by this evening.  LC discussed the goal of gradually stretching the baby's belly so the baby gets hungry and will eat more. Discussed nutritive vs non - nutritive feeding and to watch for hanging out latched.  LC recommended feeding the baby 15 -20 mins 1st breast and then supplement. And post pump both breast after at least 5-6 feedings a day.  Sore nipple and engorgement prevention and tx reviewed.  Per mom has a DEBP at home.  LC offered mom and LC O/P appt. And mom receptive.  LC placed in the Clinic basket in Epic for them to call mom.  Mom aware she will get a phone call from the clinic.  Mother informed of post-discharge support and given phone number to the lactation department, including services for phone call assistance; out-patient appointments; and breastfeeding support group. List of other breastfeeding resources in the community given in the handout. Encouraged mother to call for problems or concerns related to breastfeeding.   Maternal Data    Feeding Feeding Type: (per mom baby was last fed by the NT )  LATCH Score                   Interventions Interventions: Breast feeding basics reviewed  Lactation Tools Discussed/Used Tools: Pump Breast pump type: Double-Electric Breast Pump Pump Review: Milk Storage Initiated by:: LC reviewed  Date initiated:: 08/25/17   Consult Status Consult Status: Follow-up Date: ([facility] receptive  [facility] ) Follow-up type: Out-patient    Matilde SprangMargaret Ann Tierrah Anastos 08/25/2017, 12:00 PM

## 2017-09-10 ENCOUNTER — Inpatient Hospital Stay (HOSPITAL_COMMUNITY): Admit: 2017-09-10 | Payer: Medicaid Other | Admitting: Obstetrics and Gynecology
